# Patient Record
Sex: Female | Born: 1935 | Race: White | Hispanic: No
Health system: Southern US, Community
[De-identification: ages and names within clinical notes are randomized; demographics above are authoritative.]

## PROBLEM LIST (undated history)

## (undated) DIAGNOSIS — I482 Chronic atrial fibrillation, unspecified: Secondary | ICD-10-CM

## (undated) DIAGNOSIS — J189 Pneumonia, unspecified organism: Secondary | ICD-10-CM

## (undated) DIAGNOSIS — G4733 Obstructive sleep apnea (adult) (pediatric): Secondary | ICD-10-CM

## (undated) DIAGNOSIS — J9621 Acute and chronic respiratory failure with hypoxia: Secondary | ICD-10-CM

## (undated) DIAGNOSIS — I270 Primary pulmonary hypertension: Secondary | ICD-10-CM

---

## 2018-07-05 ENCOUNTER — Inpatient Hospital Stay
Admission: AD | Admit: 2018-07-05 | Discharge: 2018-07-29 | Disposition: A | Discharge status: Skilled Nursing Facility | Payer: Medicare Other | Source: Other Acute Inpatient Hospital | Attending: Internal Medicine | Admitting: Internal Medicine

## 2018-07-05 ENCOUNTER — Ambulatory Visit (HOSPITAL_COMMUNITY)
Admission: AD | Admit: 2018-07-05 | Discharge: 2018-07-05 | Disposition: A | Discharge status: Home / Self Care | Payer: Medicare Other | Source: Other Acute Inpatient Hospital | Attending: Internal Medicine | Admitting: Internal Medicine

## 2018-07-05 ENCOUNTER — Other Ambulatory Visit (HOSPITAL_COMMUNITY): Payer: Medicare Other

## 2018-07-05 DIAGNOSIS — G4733 Obstructive sleep apnea (adult) (pediatric): Secondary | ICD-10-CM | POA: Diagnosis present

## 2018-07-05 DIAGNOSIS — J189 Pneumonia, unspecified organism: Secondary | ICD-10-CM | POA: Diagnosis present

## 2018-07-05 DIAGNOSIS — J9621 Acute and chronic respiratory failure with hypoxia: Secondary | ICD-10-CM | POA: Diagnosis present

## 2018-07-05 DIAGNOSIS — I482 Chronic atrial fibrillation, unspecified: Secondary | ICD-10-CM | POA: Diagnosis present

## 2018-07-05 DIAGNOSIS — R092 Respiratory arrest: Secondary | ICD-10-CM | POA: Diagnosis present

## 2018-07-05 DIAGNOSIS — R109 Unspecified abdominal pain: Secondary | ICD-10-CM

## 2018-07-05 DIAGNOSIS — I270 Primary pulmonary hypertension: Secondary | ICD-10-CM | POA: Diagnosis present

## 2018-07-05 DIAGNOSIS — Z931 Gastrostomy status: Secondary | ICD-10-CM

## 2018-07-05 HISTORY — DX: Obstructive sleep apnea (adult) (pediatric): G47.33

## 2018-07-05 HISTORY — DX: Acute and chronic respiratory failure with hypoxia: J96.21

## 2018-07-05 HISTORY — DX: Primary pulmonary hypertension: I27.0

## 2018-07-05 HISTORY — DX: Pneumonia, unspecified organism: J18.9

## 2018-07-05 HISTORY — DX: Chronic atrial fibrillation, unspecified: I48.20

## 2018-07-05 MED ORDER — IOPAMIDOL (ISOVUE-300) INJECTION 61%
30.0000 mL | Freq: Once | INTRAVENOUS | Status: AC | PRN
  Administered 2018-07-05: 30 mL

## 2018-07-05 MED ORDER — IOPAMIDOL (ISOVUE-300) INJECTION 61%
INTRAVENOUS | Status: AC
  Filled 2018-07-05: qty 50

## 2018-07-06 DIAGNOSIS — J189 Pneumonia, unspecified organism: Secondary | ICD-10-CM | POA: Diagnosis not present

## 2018-07-06 DIAGNOSIS — I270 Primary pulmonary hypertension: Secondary | ICD-10-CM

## 2018-07-06 DIAGNOSIS — I482 Chronic atrial fibrillation, unspecified: Secondary | ICD-10-CM | POA: Diagnosis not present

## 2018-07-06 DIAGNOSIS — J9621 Acute and chronic respiratory failure with hypoxia: Secondary | ICD-10-CM | POA: Diagnosis not present

## 2018-07-06 DIAGNOSIS — G4733 Obstructive sleep apnea (adult) (pediatric): Secondary | ICD-10-CM | POA: Diagnosis not present

## 2018-07-06 LAB — BASIC METABOLIC PANEL
Anion gap: 11 (ref 5–15)
BUN: 23 mg/dL (ref 8–23)
CALCIUM: 8.7 mg/dL — AB (ref 8.9–10.3)
CO2: 34 mmol/L — ABNORMAL HIGH (ref 22–32)
CREATININE: 0.96 mg/dL (ref 0.44–1.00)
Chloride: 90 mmol/L — ABNORMAL LOW (ref 98–111)
GFR calc Af Amer: >60 mL/min (ref >60)
GFR, EST NON AFRICAN AMERICAN: 55 mL/min — AB (ref >60)
Glucose, Bld: 154 mg/dL — ABNORMAL HIGH (ref 70–99)
Potassium: 4.1 mmol/L (ref 3.5–5.1)
Sodium: 135 mmol/L (ref 135–145)

## 2018-07-06 LAB — CBC WITH DIFFERENTIAL/PLATELET
Abs Immature Granulocytes: 0.09 10*3/uL — ABNORMAL HIGH (ref 0.00–0.07)
BASOS PCT: 0 %
Basophils Absolute: 0 10*3/uL (ref 0.0–0.1)
EOS PCT: 0 %
Eosinophils Absolute: 0 10*3/uL (ref 0.0–0.5)
HCT: 28.7 % — ABNORMAL LOW (ref 36.0–46.0)
HEMOGLOBIN: 9.2 g/dL — AB (ref 12.0–15.0)
Immature Granulocytes: 1 %
Lymphocytes Relative: 3 %
Lymphs Abs: 0.3 10*3/uL — ABNORMAL LOW (ref 0.7–4.0)
MCH: 28.7 pg (ref 26.0–34.0)
MCHC: 32.1 g/dL (ref 30.0–36.0)
MCV: 89.4 fL (ref 80.0–100.0)
Monocytes Absolute: 0.4 10*3/uL (ref 0.1–1.0)
Monocytes Relative: 3 %
Neutro Abs: 12.8 10*3/uL — ABNORMAL HIGH (ref 1.7–7.7)
Neutrophils Relative %: 93 %
Platelets: 222 10*3/uL (ref 150–400)
RBC: 3.21 MIL/uL — ABNORMAL LOW (ref 3.87–5.11)
RDW: 13.2 % (ref 11.5–15.5)
WBC: 13.6 10*3/uL — ABNORMAL HIGH (ref 4.0–10.5)
nRBC: 0 % (ref 0.0–0.2)

## 2018-07-06 LAB — BLOOD GAS, ARTERIAL
Acid-Base Excess: 13.1 mmol/L — ABNORMAL HIGH (ref 0.0–2.0)
Bicarbonate: 37.8 mmol/L — ABNORMAL HIGH (ref 20.0–28.0)
FIO2: 0.5
MECHVT: 400 mL
O2 Saturation: 94.4 %
PCO2 ART: 55.2 mmHg — AB (ref 32.0–48.0)
PEEP: 5 cmH2O
Patient temperature: 98.6
RATE: 12 resp/min
pH, Arterial: 7.451 — ABNORMAL HIGH (ref 7.350–7.450)
pO2, Arterial: 71.2 mmHg — ABNORMAL LOW (ref 83.0–108.0)

## 2018-07-06 NOTE — Consult Note (Signed)
Pulmonary Critical Care Medicine [facility] [place]  PULMONARY SERVICE  Date of Service: 07/06/2018  PULMONARY CRITICAL CARE CONSULT   Kristi Leon  JIR:[id number]  DOB: [birth year]   DOA: 07/05/2018  Referring Physician: Carron Curie, MD  HPI: Kristi Leon is a 85 y.o. female seen for follow up of Acute on Chronic Respiratory Failure.  Patient has multiple medical problems was transferred to our facility for further management and weaning.  Patient has a history of insulin-dependent diabetes mellitus paroxysmal atrial fibrillation chronic anticoagulation hypertension hyperlipidemia overactive bladder polymyalgia rheumatica GERD sleep apnea presented to the hospital because of increased cough and shortness of breath.  At that time patient was found to have a temperature of 100.5.  Chest x-ray was done which showed multifocal pneumonia so she was started on sepsis protocol.  Patient was given fluid bolus for low blood pressure also given steroids and started on antibiotics in the form of IV azithromycin as well as Rocephin.  Conservative measures however did not show improvement patient had been using BiPAP this failed so patient ended up getting intubated on the ventilator.  During all of this cardiology was asked to see the patient because of elevated troponins and echocardiogram was done which was unremarkable.  She was weaned and extubated however because of failure to wean and extubate she was started on BiPAP this also failed and eventually ended up having to be intubated.  Subsequently underwent a tracheostomy and PEG tube for anticipated prolonged mechanical ventilation  Review of Systems:  ROS performed and is unremarkable other than noted above.  Past medical history: Hypertension Coronary artery disease Pulmonary hypertension Sleep apnea Noncompliance Atrial fibrillation Hyperlipidemia Chronic asthma Overactive bladder  Past surgical history: Tracheostomy PEG  tube  Family history: Noncontributory to the present illness  Allergies: Reviewed on the medication administration chart  Medications: Reviewed on Rounds  Physical Exam:  Vitals: Temperature 99.4 pulse 64 respiratory rate 20 blood pressure 139/73 saturations 93%  Ventilator Settings mode ventilation assist control FiO2 45% tidal volume 800 PEEP 12  . General: Comfortable at this time . Eyes: Grossly normal lids, irises & conjunctiva . ENT: grossly tongue is normal . Neck: no obvious mass . Cardiovascular: S1-S2 normal no gallop or rub . Respiratory: Scattered distant rhonchi diminished . Abdomen: Soft and nontender . Skin: no rash seen on limited exam . Musculoskeletal: not rigid . Psychiatric:unable to assess . Neurologic: no seizure no involuntary movements         Labs on Admission:  Basic Metabolic Panel: Recent Labs  Lab 07/06/18 0456  NA 135  K 4.1  CL 90*  CO2 34*  GLUCOSE 154*  BUN 23  CREATININE 0.96  CALCIUM 8.7*    Recent Labs  Lab 07/06/18 0510  PHART 7.451*  PCO2ART 55.2*  PO2ART 71.2*  HCO3 37.8*  O2SAT 94.4    Liver Function Tests: No results for input(s): AST, ALT, ALKPHOS, BILITOT, PROT, ALBUMIN in the last 168 hours. No results for input(s): LIPASE, AMYLASE in the last 168 hours. No results for input(s): AMMONIA in the last 168 hours.  CBC: Recent Labs  Lab 07/06/18 0456  WBC 13.6*  NEUTROABS 12.8*  HGB 9.2*  HCT 28.7*  MCV 89.4  PLT 222    Cardiac Enzymes: No results for input(s): CKTOTAL, CKMB, CKMBINDEX, TROPONINI in the last 168 hours.  BNP (last 3 results) No results for input(s): BNP in the last 8760 hours.  ProBNP (last 3 results) No results for input(s): PROBNP  in the last 8760 hours.   Radiological Exams on Admission: Dg Abdomen Peg Tube Location  Result Date: 07/05/2018 CLINICAL DATA:  Peg tube placement. EXAM: ABDOMEN - 1 VIEW COMPARISON:  None. FINDINGS: Contrast injected through the gastrostomy tube  opacifies the gastric fundus. The bowel gas pattern is normal. No radio-opaque calculi or other significant radiographic abnormality are seen. IMPRESSION: 1. Appropriately positioned gastrostomy tube. Electronically Signed   By: Obie Dredge M.D.   On: 07/05/2018 21:10    Assessment/Plan Active Problems:   Acute on chronic respiratory failure with hypoxia (HCC)   Chronic atrial fibrillation   Community acquired pneumonia, bilateral   Obstructive sleep apnea   Pulmonary hypertension, primary (HCC)   1. Acute on chronic respiratory failure with hypoxia at this time patient is on assist control with an FiO2 45% tidal volume 800 PEEP 12 our first goal will be to try to wean the FiO2 and PEEP as tolerated.  Once she is down to the appropriate levels we will begin her on a wean protocol. 2. Chronic atrial fibrillation patient is on anticoagulation with Eliquis monitor for bleeding continue with supportive care. 3. Bilateral pneumonia multifocal patient was treated with antibiotics with azithromycin and Rocephin continue with supportive care. 4. Obstructive sleep apnea nonissue at this time 5. Pulmonary hypertension continue with oxygen therapy supportive care  I have personally seen and evaluated the patient, evaluated laboratory and imaging results, formulated the assessment and plan and placed orders. The Patient requires high complexity decision making for assessment and support.  Case was discussed on Rounds with the Respiratory Therapy Staff Time Spent  Yevonne Pax, MD [facility] Pulmonary Critical Care Medicine Sleep Medicine

## 2018-07-07 ENCOUNTER — Encounter: Payer: Self-pay | Admitting: Internal Medicine

## 2018-07-07 ENCOUNTER — Other Ambulatory Visit (HOSPITAL_COMMUNITY): Payer: Medicare Other

## 2018-07-07 DIAGNOSIS — J9621 Acute and chronic respiratory failure with hypoxia: Secondary | ICD-10-CM | POA: Diagnosis present

## 2018-07-07 DIAGNOSIS — I482 Chronic atrial fibrillation, unspecified: Secondary | ICD-10-CM | POA: Diagnosis not present

## 2018-07-07 DIAGNOSIS — J189 Pneumonia, unspecified organism: Secondary | ICD-10-CM | POA: Diagnosis present

## 2018-07-07 DIAGNOSIS — G4733 Obstructive sleep apnea (adult) (pediatric): Secondary | ICD-10-CM | POA: Diagnosis present

## 2018-07-07 DIAGNOSIS — I270 Primary pulmonary hypertension: Secondary | ICD-10-CM | POA: Diagnosis present

## 2018-07-07 LAB — C DIFFICILE QUICK SCREEN W PCR REFLEX
C DIFFICILE (CDIFF) INTERP: NOT DETECTED
C Diff antigen: NEGATIVE
C Diff toxin: NEGATIVE

## 2018-07-07 NOTE — Progress Notes (Addendum)
[facility] [place]   PULMONARY CRITICAL CARE SERVICE  PROGRESS NOTE  Date of Service: 07/07/2018  Kristi Reelse Tang Jasmer  ZOX:[id number]  DOB: [birth year]   DOA: 07/05/2018  Referring Physician: Carron CurieAli Hijazi, MD  HPI: Kristi Leon is a 85 y.o. female seen for follow up of Acute on Chronic Respiratory Failure.  Patient remains on full ventilator support at this time.  However PEEP was adjusted from 12-10 today.  Patient seems to be tolerating well we will continue to wean as available.  Medications: Reviewed on Rounds  Physical Exam:  Vitals: Pulse 60 respirations 12 BP 139/62 O2 sat 96% temp 97.9  Ventilator Settings ventilator mode AC VC rate 18 tidal volume 400 PEEP 10 FiO2 40%  . General: Comfortable at this time . Eyes: Grossly normal lids, irises & conjunctiva . ENT: grossly tongue is normal . Neck: no obvious mass . Cardiovascular: S1 S2 normal no gallop . Respiratory: Coarse breath sounds noted . Abdomen: soft . Skin: no rash seen on limited exam . Musculoskeletal: not rigid . Psychiatric:unable to assess . Neurologic: no seizure no involuntary movements         Lab Data:   Basic Metabolic Panel: Recent Labs  Lab 07/06/18 0456  NA 135  K 4.1  CL 90*  CO2 34*  GLUCOSE 154*  BUN 23  CREATININE 0.96  CALCIUM 8.7*    ABG: Recent Labs  Lab 07/06/18 0510  PHART 7.451*  PCO2ART 55.2*  PO2ART 71.2*  HCO3 37.8*  O2SAT 94.4    Liver Function Tests: No results for input(s): AST, ALT, ALKPHOS, BILITOT, PROT, ALBUMIN in the last 168 hours. No results for input(s): LIPASE, AMYLASE in the last 168 hours. No results for input(s): AMMONIA in the last 168 hours.  CBC: Recent Labs  Lab 07/06/18 0456  WBC 13.6*  NEUTROABS 12.8*  HGB 9.2*  HCT 28.7*  MCV 89.4  PLT 222    Cardiac Enzymes: No results for input(s): CKTOTAL, CKMB, CKMBINDEX, TROPONINI in the last 168 hours.  BNP (last 3 results) No results for  input(s): BNP in the last 8760 hours.  ProBNP (last 3 results) No results for input(s): PROBNP in the last 8760 hours.  Radiological Exams: Dg Abdomen Peg Tube Location  Result Date: 07/05/2018 CLINICAL DATA:  Peg tube placement. EXAM: ABDOMEN - 1 VIEW COMPARISON:  None. FINDINGS: Contrast injected through the gastrostomy tube opacifies the gastric fundus. The bowel gas pattern is normal. No radio-opaque calculi or other significant radiographic abnormality are seen. IMPRESSION: 1. Appropriately positioned gastrostomy tube. Electronically Signed   By: Obie DredgeWilliam T Derry M.D.   On: 07/05/2018 21:10   Dg Abd Portable 1v  Result Date: 07/07/2018 CLINICAL DATA:  Abdominal pain. EXAM: PORTABLE ABDOMEN - 1 VIEW COMPARISON:  Radiograph July 05, 2018. FINDINGS: The bowel gas pattern is normal. Gastrostomy tube noted on prior exam is not clearly visualized currently. No radio-opaque calculi or other significant radiographic abnormality are seen. IMPRESSION: No evidence of bowel obstruction or ileus. Gastrostomy tube seen on prior exam is not clearly visualized currently. Electronically Signed   By: Lupita RaiderJames  Green Jr, M.D.   On: 07/07/2018 17:55    Assessment/Plan Active Problems:   Acute on chronic respiratory failure with hypoxia (HCC)   Chronic atrial fibrillation   Community acquired pneumonia, bilateral   Obstructive sleep apnea   Pulmonary hypertension, primary (HCC)   1. Acute on chronic respiratory failure with hypoxia patient remains on 40% FiO2 full support AC  VC.  Weaned from a PEEP of 12 to a PEEP of 10 today patient is doing well.  We will continue as tolerated. 2. Chronic atrial fibrillation patient is anticoagulated with Eliquis continue to monitor, continue supportive care. 3. Bilateral pneumonia multifocal patient was treated with antibiotics continue supportive care 4. Obstructive sleep apnea nonissue 5. Pulmonary hypertension continue O2 therapy and supportive care   I have  personally seen and evaluated the patient, evaluated laboratory and imaging results, formulated the assessment and plan and placed orders. The Patient requires high complexity decision making for assessment and support.  Case was discussed on Rounds with the Respiratory Therapy Staff  Yevonne PaxSaadat A Khan, MD [facility] Pulmonary Critical Care Medicine Sleep Medicine

## 2018-07-08 DIAGNOSIS — I482 Chronic atrial fibrillation, unspecified: Secondary | ICD-10-CM | POA: Diagnosis not present

## 2018-07-08 DIAGNOSIS — J9621 Acute and chronic respiratory failure with hypoxia: Secondary | ICD-10-CM | POA: Diagnosis not present

## 2018-07-08 DIAGNOSIS — J189 Pneumonia, unspecified organism: Secondary | ICD-10-CM | POA: Diagnosis not present

## 2018-07-08 DIAGNOSIS — G4733 Obstructive sleep apnea (adult) (pediatric): Secondary | ICD-10-CM | POA: Diagnosis not present

## 2018-07-08 NOTE — Progress Notes (Signed)
Pulmonary Critical Care Medicine [facility] [place]   PULMONARY CRITICAL CARE SERVICE  PROGRESS NOTE  Date of Service: 07/08/2018  Kristi Leon  PVX:[id number]  DOB: June 07, 1934   DOA: 07/05/2018  Referring Physician: Carron Curie, MD  HPI: Kristi Leon is a 85 y.o. female seen for follow up of Acute on Chronic Respiratory Failure.  Patient was able to wean for a couple of hours today right now is on pressure support mode has been on 45% FiO2 however  Medications: Reviewed on Rounds  Physical Exam:  Vitals: Temperature 98.4 pulse 78 respiratory rate 22 blood pressure 153/79 saturations 99%  Ventilator Settings mode ventilation pressure support FiO2 45% PEEP 8 pressure support 12  . General: Comfortable at this time . Eyes: Grossly normal lids, irises & conjunctiva . ENT: grossly tongue is normal . Neck: no obvious mass . Cardiovascular: S1 S2 normal no gallop . Respiratory: Coarse breath sounds with a few rhonchi . Abdomen: soft . Skin: no rash seen on limited exam . Musculoskeletal: not rigid . Psychiatric:unable to assess . Neurologic: no seizure no involuntary movements         Lab Data:   Basic Metabolic Panel: Recent Labs  Lab 07/06/18 0456  NA 135  K 4.1  CL 90*  CO2 34*  GLUCOSE 154*  BUN 23  CREATININE 0.96  CALCIUM 8.7*    ABG: Recent Labs  Lab 07/06/18 0510  PHART 7.451*  PCO2ART 55.2*  PO2ART 71.2*  HCO3 37.8*  O2SAT 94.4    Liver Function Tests: No results for input(s): AST, ALT, ALKPHOS, BILITOT, PROT, ALBUMIN in the last 168 hours. No results for input(s): LIPASE, AMYLASE in the last 168 hours. No results for input(s): AMMONIA in the last 168 hours.  CBC: Recent Labs  Lab 07/06/18 0456  WBC 13.6*  NEUTROABS 12.8*  HGB 9.2*  HCT 28.7*  MCV 89.4  PLT 222    Cardiac Enzymes: No results for input(s): CKTOTAL, CKMB, CKMBINDEX, TROPONINI in the last 168 hours.  BNP (last 3 results) No results for input(s):  BNP in the last 8760 hours.  ProBNP (last 3 results) No results for input(s): PROBNP in the last 8760 hours.  Radiological Exams: Dg Abd Portable 1v  Result Date: 07/07/2018 CLINICAL DATA:  Abdominal pain. EXAM: PORTABLE ABDOMEN - 1 VIEW COMPARISON:  Radiograph July 05, 2018. FINDINGS: The bowel gas pattern is normal. Gastrostomy tube noted on prior exam is not clearly visualized currently. No radio-opaque calculi or other significant radiographic abnormality are seen. IMPRESSION: No evidence of bowel obstruction or ileus. Gastrostomy tube seen on prior exam is not clearly visualized currently. Electronically Signed   By: Lupita Raider, M.D.   On: 07/07/2018 17:55    Assessment/Plan Active Problems:   Acute on chronic respiratory failure with hypoxia (HCC)   Chronic atrial fibrillation   Community acquired pneumonia, bilateral   Obstructive sleep apnea   Pulmonary hypertension, primary (HCC)   1. Acute on chronic respiratory failure with hypoxia we will continue with the pressure support wean goal is up to 4 hours today.  Titrate oxygen as tolerated. 2. Chronic atrial fibrillation rate controlled 3. Community-acquired pneumonia treated improving 4. OSA nonissue at this time 5. Pulmonary hypertension continue oxygen therapy   I have personally seen and evaluated the patient, evaluated laboratory and imaging results, formulated the assessment and plan and placed orders. The Patient requires high complexity decision making for assessment and support.  Case was discussed on Rounds with the  Respiratory Therapy Staff  Allyne Gee, MD [facility] Pulmonary Critical Care Medicine Sleep Medicine

## 2018-07-09 DIAGNOSIS — G4733 Obstructive sleep apnea (adult) (pediatric): Secondary | ICD-10-CM | POA: Diagnosis not present

## 2018-07-09 DIAGNOSIS — J189 Pneumonia, unspecified organism: Secondary | ICD-10-CM | POA: Diagnosis not present

## 2018-07-09 DIAGNOSIS — I482 Chronic atrial fibrillation, unspecified: Secondary | ICD-10-CM | POA: Diagnosis not present

## 2018-07-09 DIAGNOSIS — J9621 Acute and chronic respiratory failure with hypoxia: Secondary | ICD-10-CM | POA: Diagnosis not present

## 2018-07-09 NOTE — Progress Notes (Signed)
Pulmonary Critical Care Medicine [facility] [place]   PULMONARY CRITICAL CARE SERVICE  PROGRESS NOTE  Date of Service: 07/09/2018  Laliah Lichte  XTA:[id number]  DOB: 04-Dec-1933   DOA: 07/05/2018  Referring Physician: Carron Curie, MD  HPI: Kaylonie Blandford is a 85 y.o. female seen for follow up of Acute on Chronic Respiratory Failure.  Patient is weaning right now on pressure support mode has been doing well 28% FiO2 pressure 12 PEEP 8  Medications: Reviewed on Rounds  Physical Exam:  Vitals: Temperature 98.0 pulse 114 respiratory rate 30 blood pressure 143/84 saturations 100%  Ventilator Settings mode ventilation pressure support FiO2 28% pressure support 12 PEEP 8  . General: Comfortable at this time . Eyes: Grossly normal lids, irises & conjunctiva . ENT: grossly tongue is normal . Neck: no obvious mass . Cardiovascular: S1 S2 normal no gallop . Respiratory: Coarse breath sounds with few rhonchi . Abdomen: soft . Skin: no rash seen on limited exam . Musculoskeletal: not rigid . Psychiatric:unable to assess . Neurologic: no seizure no involuntary movements         Lab Data:   Basic Metabolic Panel: Recent Labs  Lab 07/06/18 0456  NA 135  K 4.1  CL 90*  CO2 34*  GLUCOSE 154*  BUN 23  CREATININE 0.96  CALCIUM 8.7*    ABG: Recent Labs  Lab 07/06/18 0510  PHART 7.451*  PCO2ART 55.2*  PO2ART 71.2*  HCO3 37.8*  O2SAT 94.4    Liver Function Tests: No results for input(s): AST, ALT, ALKPHOS, BILITOT, PROT, ALBUMIN in the last 168 hours. No results for input(s): LIPASE, AMYLASE in the last 168 hours. No results for input(s): AMMONIA in the last 168 hours.  CBC: Recent Labs  Lab 07/06/18 0456  WBC 13.6*  NEUTROABS 12.8*  HGB 9.2*  HCT 28.7*  MCV 89.4  PLT 222    Cardiac Enzymes: No results for input(s): CKTOTAL, CKMB, CKMBINDEX, TROPONINI in the last 168 hours.  BNP (last 3 results) No results for input(s): BNP in the last 8760  hours.  ProBNP (last 3 results) No results for input(s): PROBNP in the last 8760 hours.  Radiological Exams: Dg Abd Portable 1v  Result Date: 07/07/2018 CLINICAL DATA:  Abdominal pain. EXAM: PORTABLE ABDOMEN - 1 VIEW COMPARISON:  Radiograph July 05, 2018. FINDINGS: The bowel gas pattern is normal. Gastrostomy tube noted on prior exam is not clearly visualized currently. No radio-opaque calculi or other significant radiographic abnormality are seen. IMPRESSION: No evidence of bowel obstruction or ileus. Gastrostomy tube seen on prior exam is not clearly visualized currently. Electronically Signed   By: Lupita Raider, M.D.   On: 07/07/2018 17:55    Assessment/Plan Active Problems:   Acute on chronic respiratory failure with hypoxia (HCC)   Chronic atrial fibrillation   Community acquired pneumonia, bilateral   Obstructive sleep apnea   Pulmonary hypertension, primary (HCC)   1. Acute on chronic respiratory failure with hypoxia we will continue with pressure support titrate oxygen continue pulmonary toilet 2. Chronic atrial fibrillation rate is controlled at this time we will continue with supportive care 3. Community-acquired pneumonia treated we will continue to follow 4. OSA at baseline 5. Pulmonary hypertension supportive care oxygen therapy   I have personally seen and evaluated the patient, evaluated laboratory and imaging results, formulated the assessment and plan and placed orders. The Patient requires high complexity decision making for assessment and support.  Case was discussed on Rounds with the Respiratory Therapy  Staff  Allyne Gee, MD [facility] Pulmonary Critical Care Medicine Sleep Medicine

## 2018-07-10 DIAGNOSIS — G4733 Obstructive sleep apnea (adult) (pediatric): Secondary | ICD-10-CM | POA: Diagnosis not present

## 2018-07-10 DIAGNOSIS — J9621 Acute and chronic respiratory failure with hypoxia: Secondary | ICD-10-CM | POA: Diagnosis not present

## 2018-07-10 DIAGNOSIS — J189 Pneumonia, unspecified organism: Secondary | ICD-10-CM | POA: Diagnosis not present

## 2018-07-10 DIAGNOSIS — I482 Chronic atrial fibrillation, unspecified: Secondary | ICD-10-CM | POA: Diagnosis not present

## 2018-07-10 NOTE — Progress Notes (Addendum)
Pulmonary Critical Care Medicine [facility] [place]   PULMONARY CRITICAL CARE SERVICE  PROGRESS NOTE  Date of Service: 07/10/2018  Kristi Leon  NHA:[id number]  DOB: [birth year]   DOA: 07/05/2018  Referring Physician: Carron Curie, MD  HPI: Kristi Leon is a 85 y.o. female seen for follow up of Acute on Chronic Respiratory Failure.  Patient is currently weaning and is on pressure support mode.  Has been doing well on pressure support 12/5 FiO2 28%  Medications: Reviewed on Rounds  Physical Exam:  Vitals: Pulse 91 respirations 20 BP 134/76 O2 sat 99% temp 98.5  Ventilator Settings ventilator mode pressure support 12/5 FiO2 20%  . General: Comfortable at this time . Eyes: Grossly normal lids, irises & conjunctiva . ENT: grossly tongue is normal . Neck: no obvious mass . Cardiovascular: S1 S2 normal no gallop . Respiratory: Coarse breath sounds . Abdomen: soft . Skin: no rash seen on limited exam . Musculoskeletal: not rigid . Psychiatric:unable to assess . Neurologic: no seizure no involuntary movements         Lab Data:   Basic Metabolic Panel: Recent Labs  Lab 07/06/18 0456  NA 135  K 4.1  CL 90*  CO2 34*  GLUCOSE 154*  BUN 23  CREATININE 0.96  CALCIUM 8.7*    ABG: Recent Labs  Lab 07/06/18 0510  PHART 7.451*  PCO2ART 55.2*  PO2ART 71.2*  HCO3 37.8*  O2SAT 94.4    Liver Function Tests: No results for input(s): AST, ALT, ALKPHOS, BILITOT, PROT, ALBUMIN in the last 168 hours. No results for input(s): LIPASE, AMYLASE in the last 168 hours. No results for input(s): AMMONIA in the last 168 hours.  CBC: Recent Labs  Lab 07/06/18 0456  WBC 13.6*  NEUTROABS 12.8*  HGB 9.2*  HCT 28.7*  MCV 89.4  PLT 222    Cardiac Enzymes: No results for input(s): CKTOTAL, CKMB, CKMBINDEX, TROPONINI in the last 168 hours.  BNP (last 3 results) No results for input(s): BNP in the last 8760 hours.  ProBNP (last 3 results) No results for  input(s): PROBNP in the last 8760 hours.  Radiological Exams: No results found.  Assessment/Plan Active Problems:   Acute on chronic respiratory failure with hypoxia (HCC)   Chronic atrial fibrillation   Community acquired pneumonia, bilateral   Obstructive sleep apnea   Pulmonary hypertension, primary (HCC)   1. Acute on chronic respiratory failure with hypoxia continue pressure support and titrate oxygen per protocol.  Continue pulmonary toilet. 2. Chronic atrial fibrillation rate is controlled continue supportive care 3. Community-acquired pneumonia treated continue to follow 4. OSA at baseline 5. Pulmonary hypertension continue supportive care and oxygen therapy   I have personally seen and evaluated the patient, evaluated laboratory and imaging results, formulated the assessment and plan and placed orders. The Patient requires high complexity decision making for assessment and support.  Case was discussed on Rounds with the Respiratory Therapy Staff  Yevonne Pax, MD [facility] Pulmonary Critical Care Medicine Sleep Medicine

## 2018-07-11 DIAGNOSIS — I482 Chronic atrial fibrillation, unspecified: Secondary | ICD-10-CM | POA: Diagnosis not present

## 2018-07-11 DIAGNOSIS — G4733 Obstructive sleep apnea (adult) (pediatric): Secondary | ICD-10-CM | POA: Diagnosis not present

## 2018-07-11 DIAGNOSIS — J189 Pneumonia, unspecified organism: Secondary | ICD-10-CM | POA: Diagnosis not present

## 2018-07-11 DIAGNOSIS — J9621 Acute and chronic respiratory failure with hypoxia: Secondary | ICD-10-CM | POA: Diagnosis not present

## 2018-07-11 LAB — CBC WITH DIFFERENTIAL/PLATELET
Abs Immature Granulocytes: 0.09 10*3/uL — ABNORMAL HIGH (ref 0.00–0.07)
Basophils Absolute: 0 10*3/uL (ref 0.0–0.1)
Basophils Relative: 0 %
EOS ABS: 0 10*3/uL (ref 0.0–0.5)
Eosinophils Relative: 0 %
HCT: 33.4 % — ABNORMAL LOW (ref 36.0–46.0)
Hemoglobin: 10.5 g/dL — ABNORMAL LOW (ref 12.0–15.0)
Immature Granulocytes: 1 %
LYMPHS ABS: 0.7 10*3/uL (ref 0.7–4.0)
Lymphocytes Relative: 6 %
MCH: 28.1 pg (ref 26.0–34.0)
MCHC: 31.4 g/dL (ref 30.0–36.0)
MCV: 89.3 fL (ref 80.0–100.0)
Monocytes Absolute: 0.6 10*3/uL (ref 0.1–1.0)
Monocytes Relative: 5 %
Neutro Abs: 10.8 10*3/uL — ABNORMAL HIGH (ref 1.7–7.7)
Neutrophils Relative %: 88 %
Platelets: 272 10*3/uL (ref 150–400)
RBC: 3.74 MIL/uL — ABNORMAL LOW (ref 3.87–5.11)
RDW: 13.2 % (ref 11.5–15.5)
WBC: 12.3 10*3/uL — ABNORMAL HIGH (ref 4.0–10.5)
nRBC: 0 % (ref 0.0–0.2)

## 2018-07-11 NOTE — Progress Notes (Addendum)
Pulmonary Critical Care Medicine [facility] [place]   PULMONARY CRITICAL CARE SERVICE  PROGRESS NOTE  Date of Service: 07/11/2018  Kristi Leon  XKG:[id number]  DOB: 1933/10/18   DOA: 07/05/2018  Referring Physician: Carron Curie, MD  HPI: Kristi Leon is a 85 y.o. female seen for follow up of Acute on Chronic Respiratory Failure.  Patient continues weaning protocol.  She has a goal of 16 hours today on pressure support 12/5 with FiO2 28%.  She is currently doing well minimal secretions.  Medications: Reviewed on Rounds  Physical Exam:  Vitals: Pulse 100 respirations 20 BP 124/74 O2 sat 100% temp 97.7  Ventilator Settings ventilator mode pressure support 12/5 FiO2 20%  . General: Comfortable at this time . Eyes: Grossly normal lids, irises & conjunctiva . ENT: grossly tongue is normal . Neck: no obvious mass . Cardiovascular: S1 S2 normal no gallop . Respiratory: No rales or rhonchi noted . Abdomen: soft . Skin: no rash seen on limited exam . Musculoskeletal: not rigid . Psychiatric:unable to assess . Neurologic: no seizure no involuntary movements         Lab Data:   Basic Metabolic Panel: Recent Labs  Lab 07/06/18 0456  NA 135  K 4.1  CL 90*  CO2 34*  GLUCOSE 154*  BUN 23  CREATININE 0.96  CALCIUM 8.7*    ABG: Recent Labs  Lab 07/06/18 0510  PHART 7.451*  PCO2ART 55.2*  PO2ART 71.2*  HCO3 37.8*  O2SAT 94.4    Liver Function Tests: No results for input(s): AST, ALT, ALKPHOS, BILITOT, PROT, ALBUMIN in the last 168 hours. No results for input(s): LIPASE, AMYLASE in the last 168 hours. No results for input(s): AMMONIA in the last 168 hours.  CBC: Recent Labs  Lab 07/06/18 0456 07/11/18 0611  WBC 13.6* 12.3*  NEUTROABS 12.8* 10.8*  HGB 9.2* 10.5*  HCT 28.7* 33.4*  MCV 89.4 89.3  PLT 222 272    Cardiac Enzymes: No results for input(s): CKTOTAL, CKMB, CKMBINDEX, TROPONINI in the last 168 hours.  BNP (last 3 results) No  results for input(s): BNP in the last 8760 hours.  ProBNP (last 3 results) No results for input(s): PROBNP in the last 8760 hours.  Radiological Exams: No results found.  Assessment/Plan Active Problems:   Acute on chronic respiratory failure with hypoxia (HCC)   Chronic atrial fibrillation   Community acquired pneumonia, bilateral   Obstructive sleep apnea   Pulmonary hypertension, primary (HCC)   1. Acute on chronic respiratory failure with hypoxia continue presents port and titrate oxygen per protocol.  Continue pulmonary toilet. 2. Chronic atrial fibrillation rate controlled continue supportive care 3. Community-acquired pneumonia treated continue to follow 4. OSA at baseline 5. Pulmonary hypertension continue supportive care and oxygen therapy   I have personally seen and evaluated the patient, evaluated laboratory and imaging results, formulated the assessment and plan and placed orders. The Patient requires high complexity decision making for assessment and support.  Case was discussed on Rounds with the Respiratory Therapy Staff  Yevonne Pax, MD [facility] Pulmonary Critical Care Medicine Sleep Medicine

## 2018-07-12 DIAGNOSIS — I482 Chronic atrial fibrillation, unspecified: Secondary | ICD-10-CM | POA: Diagnosis not present

## 2018-07-12 DIAGNOSIS — J9621 Acute and chronic respiratory failure with hypoxia: Secondary | ICD-10-CM | POA: Diagnosis not present

## 2018-07-12 DIAGNOSIS — G4733 Obstructive sleep apnea (adult) (pediatric): Secondary | ICD-10-CM | POA: Diagnosis not present

## 2018-07-12 DIAGNOSIS — J189 Pneumonia, unspecified organism: Secondary | ICD-10-CM | POA: Diagnosis not present

## 2018-07-12 LAB — URINE CULTURE

## 2018-07-12 NOTE — Progress Notes (Signed)
Pulmonary Critical Care Medicine [facility] [place]   PULMONARY CRITICAL CARE SERVICE  PROGRESS NOTE  Date of Service: 07/12/2018  Kristi Leon  MLJ:[id number]  DOB: 06-19-1933   DOA: 07/05/2018  Referring Physician: Carron Curie, MD  HPI: Kristi Leon is a 85 y.o. female seen for follow up of Acute on Chronic Respiratory Failure.  Patient is comfortable right now on 40% FiO2 has been weaning on T collar  Medications: Reviewed on Rounds  Physical Exam:  Vitals: Temperature 97.5 pulse 94 respiratory 22 blood pressure 120/64 saturations 98%  Ventilator Settings off the ventilator on T collar FiO2 40%  . General: Comfortable at this time . Eyes: Grossly normal lids, irises & conjunctiva . ENT: grossly tongue is normal . Neck: no obvious mass . Cardiovascular: S1 S2 normal no gallop . Respiratory: Scattered rhonchi expansion is equal . Abdomen: soft . Skin: no rash seen on limited exam . Musculoskeletal: not rigid . Psychiatric:unable to assess . Neurologic: no seizure no involuntary movements         Lab Data:   Basic Metabolic Panel: Recent Labs  Lab 07/06/18 0456  NA 135  K 4.1  CL 90*  CO2 34*  GLUCOSE 154*  BUN 23  CREATININE 0.96  CALCIUM 8.7*    ABG: Recent Labs  Lab 07/06/18 0510  PHART 7.451*  PCO2ART 55.2*  PO2ART 71.2*  HCO3 37.8*  O2SAT 94.4    Liver Function Tests: No results for input(s): AST, ALT, ALKPHOS, BILITOT, PROT, ALBUMIN in the last 168 hours. No results for input(s): LIPASE, AMYLASE in the last 168 hours. No results for input(s): AMMONIA in the last 168 hours.  CBC: Recent Labs  Lab 07/06/18 0456 07/11/18 0611  WBC 13.6* 12.3*  NEUTROABS 12.8* 10.8*  HGB 9.2* 10.5*  HCT 28.7* 33.4*  MCV 89.4 89.3  PLT 222 272    Cardiac Enzymes: No results for input(s): CKTOTAL, CKMB, CKMBINDEX, TROPONINI in the last 168 hours.  BNP (last 3 results) No results for input(s): BNP in the last 8760 hours.  ProBNP  (last 3 results) No results for input(s): PROBNP in the last 8760 hours.  Radiological Exams: No results found.  Assessment/Plan Active Problems:   Acute on chronic respiratory failure with hypoxia (HCC)   Chronic atrial fibrillation   Community acquired pneumonia, bilateral   Obstructive sleep apnea   Pulmonary hypertension, primary (HCC)   1. Acute on chronic respiratory failure with hypoxia we will continue with the T collar trials on 40% FiO2 try to titrate the FiO2 down 2. Chronic atrial fibrillation rate is controlled at this time 3. Community-acquired pneumonia treated we will monitor 4. OSA treated currently not an issue 5. Primary pulmonary hypertension at baseline we will continue present management   I have personally seen and evaluated the patient, evaluated laboratory and imaging results, formulated the assessment and plan and placed orders. The Patient requires high complexity decision making for assessment and support.  Case was discussed on Rounds with the Respiratory Therapy Staff  Yevonne Pax, MD [facility] Pulmonary Critical Care Medicine Sleep Medicine

## 2018-07-13 ENCOUNTER — Other Ambulatory Visit (HOSPITAL_COMMUNITY): Payer: Medicare Other

## 2018-07-13 DIAGNOSIS — J9621 Acute and chronic respiratory failure with hypoxia: Secondary | ICD-10-CM | POA: Diagnosis not present

## 2018-07-13 DIAGNOSIS — J189 Pneumonia, unspecified organism: Secondary | ICD-10-CM | POA: Diagnosis not present

## 2018-07-13 DIAGNOSIS — I482 Chronic atrial fibrillation, unspecified: Secondary | ICD-10-CM | POA: Diagnosis not present

## 2018-07-13 DIAGNOSIS — G4733 Obstructive sleep apnea (adult) (pediatric): Secondary | ICD-10-CM | POA: Diagnosis not present

## 2018-07-13 NOTE — Progress Notes (Signed)
Pulmonary Critical Care Medicine [facility] [place]   PULMONARY CRITICAL CARE SERVICE  PROGRESS NOTE  Date of Service: 07/13/2018  Kristi Leon  [id number]:[id number]  DOB: 09-22-1933   DOA: 07/05/2018  Referring Physician: Carron Curie, MD  HPI: Kristi Leon is a 85 y.o. female seen for follow up of Acute on Chronic Respiratory Failure.  Currently patient is comfortable without distress remains on 35% FiO2 on T collar at this time  Medications: Reviewed on Rounds  Physical Exam:  Vitals: Temperature 97.3 pulse 94 respiratory 21 blood pressure 119/70 saturations 95%  Ventilator Settings off the ventilator on T collar FiO2 35%  . General: Comfortable at this time . Eyes: Grossly normal lids, irises & conjunctiva . ENT: grossly tongue is normal . Neck: no obvious mass . Cardiovascular: S1 S2 normal no gallop . Respiratory: No rhonchi or rales are noted at this time . Abdomen: soft . Skin: no rash seen on limited exam . Musculoskeletal: not rigid . Psychiatric:unable to assess . Neurologic: no seizure no involuntary movements         Lab Data:   Basic Metabolic Panel: No results for input(s): NA, K, CL, CO2, GLUCOSE, BUN, CREATININE, CALCIUM, MG, PHOS in the last 168 hours.  ABG: No results for input(s): PHART, PCO2ART, PO2ART, HCO3, O2SAT in the last 168 hours.  Liver Function Tests: No results for input(s): AST, ALT, ALKPHOS, BILITOT, PROT, ALBUMIN in the last 168 hours. No results for input(s): LIPASE, AMYLASE in the last 168 hours. No results for input(s): AMMONIA in the last 168 hours.  CBC: Recent Labs  Lab 07/11/18 0611  WBC 12.3*  NEUTROABS 10.8*  HGB 10.5*  HCT 33.4*  MCV 89.3  PLT 272    Cardiac Enzymes: No results for input(s): CKTOTAL, CKMB, CKMBINDEX, TROPONINI in the last 168 hours.  BNP (last 3 results) No results for input(s): BNP in the last 8760 hours.  ProBNP (last 3 results) No results for input(s): PROBNP in the last 8760  hours.  Radiological Exams: Dg Chest Port 1 View  Result Date: 07/13/2018 CLINICAL DATA:  Chronic respiratory failure with hypoxia. EXAM: PORTABLE CHEST 1 VIEW COMPARISON:  None. FINDINGS: Mild cardiomegaly is noted. Tracheostomy tube is in good position. Hypoinflation of the lungs is noted with mild bibasilar subsegmental atelectasis. No pneumothorax or pleural effusion is noted. The visualized skeletal structures are unremarkable. IMPRESSION: Hypoinflation of the lungs with mild bibasilar subsegmental atelectasis. Electronically Signed   By: Lupita Raider, M.D.   On: 07/13/2018 10:56    Assessment/Plan Active Problems:   Acute on chronic respiratory failure with hypoxia (HCC)   Chronic atrial fibrillation   Community acquired pneumonia, bilateral   Obstructive sleep apnea   Pulmonary hypertension, primary (HCC)   1. Acute on chronic respiratory failure with hypoxia we will continue with T collar trials FiO2 35% patient's goal today is for 12 hours. 2. Chronic atrial fibrillation rate controlled at this time 3. Community-acquired pneumonia treated we will continue to follow 4. Sleep apnea nonissue 5. Pulmonary hypertension on oxygen therapy   I have personally seen and evaluated the patient, evaluated laboratory and imaging results, formulated the assessment and plan and placed orders. The Patient requires high complexity decision making for assessment and support.  Case was discussed on Rounds with the Respiratory Therapy Staff  Yevonne Pax, MD [facility] Pulmonary Critical Care Medicine Sleep Medicine

## 2018-07-14 DIAGNOSIS — J9621 Acute and chronic respiratory failure with hypoxia: Secondary | ICD-10-CM | POA: Diagnosis not present

## 2018-07-14 DIAGNOSIS — I482 Chronic atrial fibrillation, unspecified: Secondary | ICD-10-CM | POA: Diagnosis not present

## 2018-07-14 DIAGNOSIS — J189 Pneumonia, unspecified organism: Secondary | ICD-10-CM | POA: Diagnosis not present

## 2018-07-14 DIAGNOSIS — G4733 Obstructive sleep apnea (adult) (pediatric): Secondary | ICD-10-CM | POA: Diagnosis not present

## 2018-07-14 NOTE — Progress Notes (Signed)
Pulmonary Critical Care Medicine [facility] [place]   PULMONARY CRITICAL CARE SERVICE  PROGRESS NOTE  Date of Service: 07/14/2018  Kristi Leon  OZD:[id number]  DOB: [birth year]   DOA: 07/05/2018  Referring Physician: Carron Curie, MD  HPI: Kristi Leon is a 85 y.o. female seen for follow up of Acute on Chronic Respiratory Failure.  Currently patient is on T collar has been not tolerating the wean fairly well the goal is for 16 hours  Medications: Reviewed on Rounds  Physical Exam:  Vitals: Temperature 98.0 pulse 98 respiratory 24 blood pressure 114/71 saturations 96%  Ventilator Settings on T collar FiO2 35%  . General: Comfortable at this time . Eyes: Grossly normal lids, irises & conjunctiva . ENT: grossly tongue is normal . Neck: no obvious mass . Cardiovascular: S1 S2 normal no gallop . Respiratory: Scattered rhonchi expansion is equal . Abdomen: soft . Skin: no rash seen on limited exam . Musculoskeletal: not rigid . Psychiatric:unable to assess . Neurologic: no seizure no involuntary movements         Lab Data:   Basic Metabolic Panel: No results for input(s): NA, K, CL, CO2, GLUCOSE, BUN, CREATININE, CALCIUM, MG, PHOS in the last 168 hours.  ABG: No results for input(s): PHART, PCO2ART, PO2ART, HCO3, O2SAT in the last 168 hours.  Liver Function Tests: No results for input(s): AST, ALT, ALKPHOS, BILITOT, PROT, ALBUMIN in the last 168 hours. No results for input(s): LIPASE, AMYLASE in the last 168 hours. No results for input(s): AMMONIA in the last 168 hours.  CBC: Recent Labs  Lab 07/11/18 0611  WBC 12.3*  NEUTROABS 10.8*  HGB 10.5*  HCT 33.4*  MCV 89.3  PLT 272    Cardiac Enzymes: No results for input(s): CKTOTAL, CKMB, CKMBINDEX, TROPONINI in the last 168 hours.  BNP (last 3 results) No results for input(s): BNP in the last 8760 hours.  ProBNP (last 3 results) No results for input(s): PROBNP in the last 8760  hours.  Radiological Exams: Dg Chest Port 1 View  Result Date: 07/13/2018 CLINICAL DATA:  Chronic respiratory failure with hypoxia. EXAM: PORTABLE CHEST 1 VIEW COMPARISON:  None. FINDINGS: Mild cardiomegaly is noted. Tracheostomy tube is in good position. Hypoinflation of the lungs is noted with mild bibasilar subsegmental atelectasis. No pneumothorax or pleural effusion is noted. The visualized skeletal structures are unremarkable. IMPRESSION: Hypoinflation of the lungs with mild bibasilar subsegmental atelectasis. Electronically Signed   By: Lupita Raider, M.D.   On: 07/13/2018 10:56    Assessment/Plan Active Problems:   Acute on chronic respiratory failure with hypoxia (HCC)   Chronic atrial fibrillation   Community acquired pneumonia, bilateral   Obstructive sleep apnea   Pulmonary hypertension, primary (HCC)   1. Acute on chronic respiratory failure with hypoxia we will continue with the weaning on T collar continue pulmonary toilet secretion management.  Patient is supposed to do 16 hours today. 2. Chronic atrial fibrillation rate is controlled at this time we will continue present management 3. Community-acquired pneumonia bilateral treated we will follow 4. Obstructive sleep apnea nonissue at this time on the T collar 5. Pulmonary hypertension continue with oxygen therapy supportive care   I have personally seen and evaluated the patient, evaluated laboratory and imaging results, formulated the assessment and plan and placed orders. The Patient requires high complexity decision making for assessment and support.  Case was discussed on Rounds with the Respiratory Therapy Staff  Yevonne Pax, MD [facility] Pulmonary Critical Care Medicine  Sleep Medicine

## 2018-07-15 DIAGNOSIS — J9621 Acute and chronic respiratory failure with hypoxia: Secondary | ICD-10-CM | POA: Diagnosis not present

## 2018-07-15 DIAGNOSIS — J189 Pneumonia, unspecified organism: Secondary | ICD-10-CM | POA: Diagnosis not present

## 2018-07-15 DIAGNOSIS — I482 Chronic atrial fibrillation, unspecified: Secondary | ICD-10-CM | POA: Diagnosis not present

## 2018-07-15 DIAGNOSIS — G4733 Obstructive sleep apnea (adult) (pediatric): Secondary | ICD-10-CM | POA: Diagnosis not present

## 2018-07-15 NOTE — Progress Notes (Signed)
Pulmonary Critical Care Medicine [facility] [place]   PULMONARY CRITICAL CARE SERVICE  PROGRESS NOTE  Date of Service: 07/15/2018  Eather Poepping  [id number]:[id number]  DOB: 11-Sep-1933   DOA: 07/05/2018  Referring Physician: Carron Curie, MD  HPI: Tationna Marandola is a 85 y.o. female seen for follow up of Acute on Chronic Respiratory Failure.  Patient is on T collar weaning with the PMV in place she is doing well goal is 20 hours  Medications: Reviewed on Rounds  Physical Exam:  Vitals: Temperature 97.6 pulse 94 respiratory 21 blood pressure 113/65 saturation 94%  Ventilator Settings off the ventilator on T collar rate now on 35% FiO2  . General: Comfortable at this time . Eyes: Grossly normal lids, irises & conjunctiva . ENT: grossly tongue is normal . Neck: no obvious mass . Cardiovascular: S1 S2 normal no gallop . Respiratory: No rhonchi no rales are noted at this time . Abdomen: soft . Skin: no rash seen on limited exam . Musculoskeletal: not rigid . Psychiatric:unable to assess . Neurologic: no seizure no involuntary movements         Lab Data:   Basic Metabolic Panel: No results for input(s): NA, K, CL, CO2, GLUCOSE, BUN, CREATININE, CALCIUM, MG, PHOS in the last 168 hours.  ABG: No results for input(s): PHART, PCO2ART, PO2ART, HCO3, O2SAT in the last 168 hours.  Liver Function Tests: No results for input(s): AST, ALT, ALKPHOS, BILITOT, PROT, ALBUMIN in the last 168 hours. No results for input(s): LIPASE, AMYLASE in the last 168 hours. No results for input(s): AMMONIA in the last 168 hours.  CBC: Recent Labs  Lab 07/11/18 0611  WBC 12.3*  NEUTROABS 10.8*  HGB 10.5*  HCT 33.4*  MCV 89.3  PLT 272    Cardiac Enzymes: No results for input(s): CKTOTAL, CKMB, CKMBINDEX, TROPONINI in the last 168 hours.  BNP (last 3 results) No results for input(s): BNP in the last 8760 hours.  ProBNP (last 3 results) No results for input(s): PROBNP in the last  8760 hours.  Radiological Exams: No results found.  Assessment/Plan Active Problems:   Acute on chronic respiratory failure with hypoxia (HCC)   Chronic atrial fibrillation   Community acquired pneumonia, bilateral   Obstructive sleep apnea   Pulmonary hypertension, primary (HCC)   1. Acute on chronic respiratory failure with hypoxia we will continue with the wean on T collar goal 20 hours as mentioned 2. Chronic atrial fibrillation rate controlled 3. Community-acquired pneumonia treated clinically improving 4. OSA nonissue 5. Pulmonary hypertension on oxygen therapy continue   I have personally seen and evaluated the patient, evaluated laboratory and imaging results, formulated the assessment and plan and placed orders. The Patient requires high complexity decision making for assessment and support.  Case was discussed on Rounds with the Respiratory Therapy Staff  Yevonne Pax, MD [facility] Pulmonary Critical Care Medicine Sleep Medicine

## 2018-07-16 DIAGNOSIS — J189 Pneumonia, unspecified organism: Secondary | ICD-10-CM | POA: Diagnosis not present

## 2018-07-16 DIAGNOSIS — J9621 Acute and chronic respiratory failure with hypoxia: Secondary | ICD-10-CM | POA: Diagnosis not present

## 2018-07-16 DIAGNOSIS — G4733 Obstructive sleep apnea (adult) (pediatric): Secondary | ICD-10-CM | POA: Diagnosis not present

## 2018-07-16 DIAGNOSIS — I482 Chronic atrial fibrillation, unspecified: Secondary | ICD-10-CM | POA: Diagnosis not present

## 2018-07-16 LAB — CULTURE, RESPIRATORY W GRAM STAIN

## 2018-07-16 NOTE — Progress Notes (Signed)
Pulmonary Critical Care Medicine [facility] [place]   PULMONARY CRITICAL CARE SERVICE  PROGRESS NOTE  Date of Service: 07/16/2018  Kristi Leon  OFB:[id number]  DOB: August 13, 1933   DOA: 07/05/2018  Referring Physician: Carron Curie, MD  HPI: Kristi Leon is a 85 y.o. female seen for follow up of Acute on Chronic Respiratory Failure.  Patient is weaning right now on 30% FiO2 on the T collar  Medications: Reviewed on Rounds  Physical Exam:  Vitals: Temperature 98.6 pulse 90 respiratory 20 blood pressure 114/70 saturation 96%  Ventilator Settings T collar FiO2 30%  . General: Comfortable at this time . Eyes: Grossly normal lids, irises & conjunctiva . ENT: grossly tongue is normal . Neck: no obvious mass . Cardiovascular: S1 S2 normal no gallop . Respiratory: Scattered rhonchi expansion is equal . Abdomen: soft . Skin: no rash seen on limited exam . Musculoskeletal: not rigid . Psychiatric:unable to assess . Neurologic: no seizure no involuntary movements         Lab Data:   Basic Metabolic Panel: No results for input(s): NA, K, CL, CO2, GLUCOSE, BUN, CREATININE, CALCIUM, MG, PHOS in the last 168 hours.  ABG: No results for input(s): PHART, PCO2ART, PO2ART, HCO3, O2SAT in the last 168 hours.  Liver Function Tests: No results for input(s): AST, ALT, ALKPHOS, BILITOT, PROT, ALBUMIN in the last 168 hours. No results for input(s): LIPASE, AMYLASE in the last 168 hours. No results for input(s): AMMONIA in the last 168 hours.  CBC: Recent Labs  Lab 07/11/18 0611  WBC 12.3*  NEUTROABS 10.8*  HGB 10.5*  HCT 33.4*  MCV 89.3  PLT 272    Cardiac Enzymes: No results for input(s): CKTOTAL, CKMB, CKMBINDEX, TROPONINI in the last 168 hours.  BNP (last 3 results) No results for input(s): BNP in the last 8760 hours.  ProBNP (last 3 results) No results for input(s): PROBNP in the last 8760 hours.  Radiological Exams: No results  found.  Assessment/Plan Active Problems:   Acute on chronic respiratory failure with hypoxia (HCC)   Chronic atrial fibrillation   Community acquired pneumonia, bilateral   Obstructive sleep apnea   Pulmonary hypertension, primary (HCC)   1. Acute on chronic respiratory failure with hypoxia we will continue to wean on T collar titrate oxygen continue pulmonary toilet. 2. Chronic atrial fibrillation rate controlled 3. Community-acquired pneumonia treated we will continue supportive care 4. OSA treated via tracheostomy nonissue 5. Pulmonary hypertension on oxygen   I have personally seen and evaluated the patient, evaluated laboratory and imaging results, formulated the assessment and plan and placed orders. The Patient requires high complexity decision making for assessment and support.  Case was discussed on Rounds with the Respiratory Therapy Staff  Yevonne Pax, MD [facility] Pulmonary Critical Care Medicine Sleep Medicine

## 2018-07-17 DIAGNOSIS — G4733 Obstructive sleep apnea (adult) (pediatric): Secondary | ICD-10-CM | POA: Diagnosis not present

## 2018-07-17 DIAGNOSIS — J9621 Acute and chronic respiratory failure with hypoxia: Secondary | ICD-10-CM | POA: Diagnosis not present

## 2018-07-17 DIAGNOSIS — J189 Pneumonia, unspecified organism: Secondary | ICD-10-CM | POA: Diagnosis not present

## 2018-07-17 DIAGNOSIS — I482 Chronic atrial fibrillation, unspecified: Secondary | ICD-10-CM | POA: Diagnosis not present

## 2018-07-17 NOTE — Progress Notes (Addendum)
[facility] [place]   PULMONARY CRITICAL CARE SERVICE  PROGRESS NOTE  Date of Service: 07/17/2018  Kristi Reelse Tang Fitzner  ZOX:[id number]  DOB: 1933/09/23   DOA: 07/05/2018  Referring Physician: Carron CurieAli Hijazi, MD  HPI: Kristi Leon is a 85 y.o. female seen for follow up of Acute on Chronic Respiratory Failure.  Patient continues to wean on trach collar FiO2 28% at this time.  Patient is using PMV without difficulty.  Minimal secretions noted.   Medications: Reviewed on Rounds  Physical Exam:  Vitals: Pulse 99 respirations 16 BP 114/66 O2 sat 97% temp 98.3  Ventilator Settings patient's not currently on ventilator  . General: Comfortable at this time . Eyes: Grossly normal lids, irises & conjunctiva . ENT: grossly tongue is normal . Neck: no obvious mass . Cardiovascular: S1 S2 normal no gallop . Respiratory: Coarse sounds . Abdomen: soft . Skin: no rash seen on limited exam . Musculoskeletal: not rigid . Psychiatric:unable to assess . Neurologic: no seizure no involuntary movements         Lab Data:   Basic Metabolic Panel: No results for input(s): NA, K, CL, CO2, GLUCOSE, BUN, CREATININE, CALCIUM, MG, PHOS in the last 168 hours.  ABG: No results for input(s): PHART, PCO2ART, PO2ART, HCO3, O2SAT in the last 168 hours.  Liver Function Tests: No results for input(s): AST, ALT, ALKPHOS, BILITOT, PROT, ALBUMIN in the last 168 hours. No results for input(s): LIPASE, AMYLASE in the last 168 hours. No results for input(s): AMMONIA in the last 168 hours.  CBC: Recent Labs  Lab 07/11/18 0611  WBC 12.3*  NEUTROABS 10.8*  HGB 10.5*  HCT 33.4*  MCV 89.3  PLT 272    Cardiac Enzymes: No results for input(s): CKTOTAL, CKMB, CKMBINDEX, TROPONINI in the last 168 hours.  BNP (last 3 results) No results for input(s): BNP in the last 8760 hours.  ProBNP (last 3 results) No results for input(s): PROBNP in the last 8760  hours.  Radiological Exams: No results found.  Assessment/Plan Active Problems:   Acute on chronic respiratory failure with hypoxia (HCC)   Chronic atrial fibrillation   Community acquired pneumonia, bilateral   Obstructive sleep apnea   Pulmonary hypertension, primary (HCC)   1. Acute on chronic story failure with hypoxia continue to wean per protocol as tolerated.  Can continue aggressive pulmonary toilet. 2. Chronic atrial fibrillation rate controlled 3. Community-acquired pneumonia treated continue supportive care 4. OSA treated via tracheostomy no issues 5. Pulmonary hypertension on oxygen   I have personally seen and evaluated the patient, evaluated laboratory and imaging results, formulated the assessment and plan and placed orders. The Patient requires high complexity decision making for assessment and support.  Case was discussed on Rounds with the Respiratory Therapy Staff  Yevonne PaxSaadat A , MD [facility] Pulmonary Critical Care Medicine Sleep Medicine

## 2018-07-18 DIAGNOSIS — G4733 Obstructive sleep apnea (adult) (pediatric): Secondary | ICD-10-CM | POA: Diagnosis not present

## 2018-07-18 DIAGNOSIS — J9621 Acute and chronic respiratory failure with hypoxia: Secondary | ICD-10-CM | POA: Diagnosis not present

## 2018-07-18 DIAGNOSIS — I482 Chronic atrial fibrillation, unspecified: Secondary | ICD-10-CM | POA: Diagnosis not present

## 2018-07-18 DIAGNOSIS — J189 Pneumonia, unspecified organism: Secondary | ICD-10-CM | POA: Diagnosis not present

## 2018-07-18 NOTE — Progress Notes (Addendum)
Pulmonary Critical Care Medicine [facility] [place]   PULMONARY CRITICAL CARE SERVICE  PROGRESS NOTE  Date of Service: 07/18/2018  Kristi Leon  AXK:[id number]  DOB: [birth year]   DOA: 07/05/2018  Referring Physician: Carron Curie, MD  HPI: Kristi Leon is a 85 y.o. female seen for follow up of Acute on Chronic Respiratory Failure.  Patient doing well on trach collar 28% FiO2.  Using Passy-Muir valve without difficulty with minimal secretions.  Janina Mayo is changed today to a 5 XLT cuffless and capping trials may begin.  Medications: Reviewed on Rounds  Physical Exam:  Vitals: Pulse 81 respirations 18 BP 101/41 O2 sat 96% temp 97.4  Ventilator Settings not currently on ventilator  . General: Comfortable at this time . Eyes: Grossly normal lids, irises & conjunctiva . ENT: grossly tongue is normal . Neck: no obvious mass . Cardiovascular: S1 S2 normal no gallop . Respiratory: Coarse breath sounds . Abdomen: soft . Skin: no rash seen on limited exam . Musculoskeletal: not rigid . Psychiatric:unable to assess . Neurologic: no seizure no involuntary movements         Lab Data:   Basic Metabolic Panel: No results for input(s): NA, K, CL, CO2, GLUCOSE, BUN, CREATININE, CALCIUM, MG, PHOS in the last 168 hours.  ABG: No results for input(s): PHART, PCO2ART, PO2ART, HCO3, O2SAT in the last 168 hours.  Liver Function Tests: No results for input(s): AST, ALT, ALKPHOS, BILITOT, PROT, ALBUMIN in the last 168 hours. No results for input(s): LIPASE, AMYLASE in the last 168 hours. No results for input(s): AMMONIA in the last 168 hours.  CBC: No results for input(s): WBC, NEUTROABS, HGB, HCT, MCV, PLT in the last 168 hours.  Cardiac Enzymes: No results for input(s): CKTOTAL, CKMB, CKMBINDEX, TROPONINI in the last 168 hours.  BNP (last 3 results) No results for input(s): BNP in the last 8760 hours.  ProBNP (last 3 results) No results for input(s): PROBNP in the  last 8760 hours.  Radiological Exams: No results found.  Assessment/Plan Active Problems:   Acute on chronic respiratory failure with hypoxia (HCC)   Chronic atrial fibrillation   Community acquired pneumonia, bilateral   Obstructive sleep apnea   Pulmonary hypertension, primary (HCC)   1. Acute on chronic respiratory failure with hypoxia continue weaning per protocol as tolerated.  Continue aggressive pulmonary toilet and secretion management.  Change trach to #5 XLT cuffless and begin capping trials. 2. Chronic atrial fibrillation rate controlled 3. Community-acquired pneumonia treated continue supportive care 4. OSA treated via tracheostomy no issues 5. Pulmonary hypertension on oxygen   I have personally seen and evaluated the patient, evaluated laboratory and imaging results, formulated the assessment and plan and placed orders. The Patient requires high complexity decision making for assessment and support.  Case was discussed on Rounds with the Respiratory Therapy Staff  Yevonne Pax, MD [facility] Pulmonary Critical Care Medicine Sleep Medicine

## 2018-07-19 DIAGNOSIS — J189 Pneumonia, unspecified organism: Secondary | ICD-10-CM | POA: Diagnosis not present

## 2018-07-19 DIAGNOSIS — G4733 Obstructive sleep apnea (adult) (pediatric): Secondary | ICD-10-CM | POA: Diagnosis not present

## 2018-07-19 DIAGNOSIS — J9621 Acute and chronic respiratory failure with hypoxia: Secondary | ICD-10-CM | POA: Diagnosis not present

## 2018-07-19 DIAGNOSIS — I482 Chronic atrial fibrillation, unspecified: Secondary | ICD-10-CM | POA: Diagnosis not present

## 2018-07-19 NOTE — Progress Notes (Signed)
Pulmonary Critical Care Medicine [facility] [place]   PULMONARY CRITICAL CARE SERVICE  PROGRESS NOTE  Date of Service: 07/19/2018  Kristi Leon  FVC:[id number]  DOB: [birth year]   DOA: 07/05/2018  Referring Physician: Carron Curie, MD  HPI: Kristi Leon is a 85 y.o. female seen for follow up of Acute on Chronic Respiratory Failure.  Patient is capping right now has been on 2 L good saturations are noted.  Medications: Reviewed on Rounds  Physical Exam:  Vitals: Temperature 97.2 pulse 85 respiratory days 18 blood pressure 92/33 saturations 95%  Ventilator Settings off the ventilator capping at this time  . General: Comfortable at this time . Eyes: Grossly normal lids, irises & conjunctiva . ENT: grossly tongue is normal . Neck: no obvious mass . Cardiovascular: S1 S2 normal no gallop . Respiratory: No rhonchi or rales are noted . Abdomen: soft . Skin: no rash seen on limited exam . Musculoskeletal: not rigid . Psychiatric:unable to assess . Neurologic: no seizure no involuntary movements         Lab Data:   Basic Metabolic Panel: No results for input(s): NA, K, CL, CO2, GLUCOSE, BUN, CREATININE, CALCIUM, MG, PHOS in the last 168 hours.  ABG: No results for input(s): PHART, PCO2ART, PO2ART, HCO3, O2SAT in the last 168 hours.  Liver Function Tests: No results for input(s): AST, ALT, ALKPHOS, BILITOT, PROT, ALBUMIN in the last 168 hours. No results for input(s): LIPASE, AMYLASE in the last 168 hours. No results for input(s): AMMONIA in the last 168 hours.  CBC: No results for input(s): WBC, NEUTROABS, HGB, HCT, MCV, PLT in the last 168 hours.  Cardiac Enzymes: No results for input(s): CKTOTAL, CKMB, CKMBINDEX, TROPONINI in the last 168 hours.  BNP (last 3 results) No results for input(s): BNP in the last 8760 hours.  ProBNP (last 3 results) No results for input(s): PROBNP in the last 8760 hours.  Radiological Exams: No results  found.  Assessment/Plan Active Problems:   Acute on chronic respiratory failure with hypoxia (HCC)   Chronic atrial fibrillation   Community acquired pneumonia, bilateral   Obstructive sleep apnea   Pulmonary hypertension, primary (HCC)   1. Acute on chronic respiratory failure with hypoxia we will continue with the capping trials titrate oxygen continue pulmonary toilet 2. Chronic atrial fibrillation rate controlled 3. Community-acquired pneumonia treated clinically improving 4. OSA has trach in place need to monitor 5. Pulmonary hypertension on oxygen treated   I have personally seen and evaluated the patient, evaluated laboratory and imaging results, formulated the assessment and plan and placed orders. The Patient requires high complexity decision making for assessment and support.  Case was discussed on Rounds with the Respiratory Therapy Staff  Yevonne Pax, MD [facility] Pulmonary Critical Care Medicine Sleep Medicine

## 2018-07-20 ENCOUNTER — Other Ambulatory Visit (HOSPITAL_COMMUNITY): Payer: Medicare Other

## 2018-07-20 DIAGNOSIS — J9621 Acute and chronic respiratory failure with hypoxia: Secondary | ICD-10-CM | POA: Diagnosis not present

## 2018-07-20 DIAGNOSIS — I482 Chronic atrial fibrillation, unspecified: Secondary | ICD-10-CM | POA: Diagnosis not present

## 2018-07-20 DIAGNOSIS — G4733 Obstructive sleep apnea (adult) (pediatric): Secondary | ICD-10-CM | POA: Diagnosis not present

## 2018-07-20 DIAGNOSIS — J189 Pneumonia, unspecified organism: Secondary | ICD-10-CM | POA: Diagnosis not present

## 2018-07-20 NOTE — Progress Notes (Signed)
[facility] [place]   PULMONARY CRITICAL CARE SERVICE  PROGRESS NOTE  Date of Service: 07/20/2018  Kristi Leon  ZOX:[id number]  DOB: 10/02/1933   DOA: 07/05/2018  Referring Physician: Carron CurieAli Hijazi, MD  HPI: Kristi Reelse Tang Toft is a 85 y.o. female seen for follow up of Acute on Chronic Respiratory Failure.  Currently is capping without distress patient's been on 2 L today the goal should be 48 hours  Medications: Reviewed on Rounds  Physical Exam:  Vitals: Temperature 97.2 pulse 80 respiratory 18 blood pressure 91/70 saturations 96%  Ventilator Settings off the ventilator capping at this time  . General: Comfortable at this time . Eyes: Grossly normal lids, irises & conjunctiva . ENT: grossly tongue is normal . Neck: no obvious mass . Cardiovascular: S1 S2 normal no gallop . Respiratory: Scattered rhonchi expansion is equal . Abdomen: soft . Skin: no rash seen on limited exam . Musculoskeletal: not rigid . Psychiatric:unable to assess . Neurologic: no seizure no involuntary movements         Lab Data:   Basic Metabolic Panel: No results for input(s): NA, K, CL, CO2, GLUCOSE, BUN, CREATININE, CALCIUM, MG, PHOS in the last 168 hours.  ABG: No results for input(s): PHART, PCO2ART, PO2ART, HCO3, O2SAT in the last 168 hours.  Liver Function Tests: No results for input(s): AST, ALT, ALKPHOS, BILITOT, PROT, ALBUMIN in the last 168 hours. No results for input(s): LIPASE, AMYLASE in the last 168 hours. No results for input(s): AMMONIA in the last 168 hours.  CBC: No results for input(s): WBC, NEUTROABS, HGB, HCT, MCV, PLT in the last 168 hours.  Cardiac Enzymes: No results for input(s): CKTOTAL, CKMB, CKMBINDEX, TROPONINI in the last 168 hours.  BNP (last 3 results) No results for input(s): BNP in the last 8760 hours.  ProBNP (last 3 results) No results for input(s): PROBNP in the last 8760 hours.  Radiological Exams: No  results found.  Assessment/Plan Active Problems:   Acute on chronic respiratory failure with hypoxia (HCC)   Chronic atrial fibrillation   Community acquired pneumonia, bilateral   Obstructive sleep apnea   Pulmonary hypertension, primary (HCC)   1. Acute on chronic respiratory failure with hypoxia we will continue with capping trials titrate oxygen continue pulmonary toilet 2. Chronic atrial fibrillation rate controlled continue to monitor 3. Community-acquired pneumonia treated follow-up radiologically 4. Obstructive sleep apnea right now nonissue will need to monitor once ready to decannulate 5. Pulmonary hypertension on oxygen therapy supportive care   I have personally seen and evaluated the patient, evaluated laboratory and imaging results, formulated the assessment and plan and placed orders. The Patient requires high complexity decision making for assessment and support.  Case was discussed on Rounds with the Respiratory Therapy Staff  Yevonne PaxSaadat A Khan, MD [facility] Pulmonary Critical Care Medicine Sleep Medicine

## 2018-07-21 DIAGNOSIS — I482 Chronic atrial fibrillation, unspecified: Secondary | ICD-10-CM | POA: Diagnosis not present

## 2018-07-21 DIAGNOSIS — J9621 Acute and chronic respiratory failure with hypoxia: Secondary | ICD-10-CM | POA: Diagnosis not present

## 2018-07-21 DIAGNOSIS — G4733 Obstructive sleep apnea (adult) (pediatric): Secondary | ICD-10-CM | POA: Diagnosis not present

## 2018-07-21 DIAGNOSIS — J189 Pneumonia, unspecified organism: Secondary | ICD-10-CM | POA: Diagnosis not present

## 2018-07-21 LAB — CBC
HCT: 37.6 % (ref 36.0–46.0)
Hemoglobin: 12.2 g/dL (ref 12.0–15.0)
MCH: 29 pg (ref 26.0–34.0)
MCHC: 32.4 g/dL (ref 30.0–36.0)
MCV: 89.5 fL (ref 80.0–100.0)
Platelets: 170 10*3/uL (ref 150–400)
RBC: 4.2 MIL/uL (ref 3.87–5.11)
RDW: 14.7 % (ref 11.5–15.5)
WBC: 15.3 10*3/uL — ABNORMAL HIGH (ref 4.0–10.5)
nRBC: 0 % (ref 0.0–0.2)

## 2018-07-21 LAB — BASIC METABOLIC PANEL
Anion gap: 8 (ref 5–15)
BUN: 65 mg/dL — ABNORMAL HIGH (ref 8–23)
CHLORIDE: 102 mmol/L (ref 98–111)
CO2: 30 mmol/L (ref 22–32)
Calcium: 8.4 mg/dL — ABNORMAL LOW (ref 8.9–10.3)
Creatinine, Ser: 1.11 mg/dL — ABNORMAL HIGH (ref 0.44–1.00)
GFR calc Af Amer: 54 mL/min — ABNORMAL LOW (ref >60)
GFR calc non Af Amer: 46 mL/min — ABNORMAL LOW (ref >60)
Glucose, Bld: 104 mg/dL — ABNORMAL HIGH (ref 70–99)
Potassium: 4 mmol/L (ref 3.5–5.1)
Sodium: 140 mmol/L (ref 135–145)

## 2018-07-21 NOTE — Progress Notes (Addendum)
Pulmonary Critical Care Medicine [facility] [place]   PULMONARY CRITICAL CARE SERVICE  PROGRESS NOTE  Date of Service: 07/21/2018  Kristi Leon  WYO:[id number]  DOB: 1934-05-08   DOA: 07/05/2018  Referring Physician: Carron Curie, MD  HPI: Kristi Leon is a 85 y.o. female seen for follow up of Acute on Chronic Respiratory Failure.  Patient has reached 48-hour goal with capping.  She currently uses 2 L of oxygen via nasal cannula when capped.  She appears in no acute distress and is tolerating capping well.  Medications: Reviewed on Rounds  Physical Exam:  Vitals: Pulse 87 respirations 16 BP 111/71 O2 sat 100% temp 97 4  Ventilator Settings patient's not only on ventilator  . General: Comfortable at this time . Eyes: Grossly normal lids, irises & conjunctiva . ENT: grossly tongue is normal . Neck: no obvious mass . Cardiovascular: S1 S2 normal no gallop . Respiratory: Scattered rhonchi . Abdomen: soft . Skin: no rash seen on limited exam . Musculoskeletal: not rigid . Psychiatric:unable to assess . Neurologic: no seizure no involuntary movements         Lab Data:   Basic Metabolic Panel: Recent Labs  Lab 07/21/18 0515  NA 140  K 4.0  CL 102  CO2 30  GLUCOSE 104*  BUN 65*  CREATININE 1.11*  CALCIUM 8.4*    ABG: No results for input(s): PHART, PCO2ART, PO2ART, HCO3, O2SAT in the last 168 hours.  Liver Function Tests: No results for input(s): AST, ALT, ALKPHOS, BILITOT, PROT, ALBUMIN in the last 168 hours. No results for input(s): LIPASE, AMYLASE in the last 168 hours. No results for input(s): AMMONIA in the last 168 hours.  CBC: Recent Labs  Lab 07/21/18 0515  WBC 15.3*  HGB 12.2  HCT 37.6  MCV 89.5  PLT 170    Cardiac Enzymes: No results for input(s): CKTOTAL, CKMB, CKMBINDEX, TROPONINI in the last 168 hours.  BNP (last 3 results) No results for input(s): BNP in the last 8760 hours.  ProBNP (last 3 results) No results for  input(s): PROBNP in the last 8760 hours.  Radiological Exams: No results found.  Assessment/Plan Active Problems:   Acute on chronic respiratory failure with hypoxia (HCC)   Chronic atrial fibrillation   Community acquired pneumonia, bilateral   Obstructive sleep apnea   Pulmonary hypertension, primary (HCC)   1. Acute on chronic respiratory care with hypoxia continue with capping trials as tolerated.  Continue to titrate oxygen and continue aggressive pulmonary toilet. 2. Chronic atrial fibrillation rate controlled continue to monitor 3. Community-acquired pneumonia treated follow-up radiologically 4. Obstructive sleep apnea nonissue will monitor once decannulated 5. Pulmonary hypertension on oxygen therapy continue supportive care   I have personally seen and evaluated the patient, evaluated laboratory and imaging results, formulated the assessment and plan and placed orders. The Patient requires high complexity decision making for assessment and support.  Case was discussed on Rounds with the Respiratory Therapy Staff  Yevonne Pax, MD [facility] Pulmonary Critical Care Medicine Sleep Medicine

## 2018-07-22 DIAGNOSIS — G4733 Obstructive sleep apnea (adult) (pediatric): Secondary | ICD-10-CM | POA: Diagnosis not present

## 2018-07-22 DIAGNOSIS — I482 Chronic atrial fibrillation, unspecified: Secondary | ICD-10-CM | POA: Diagnosis not present

## 2018-07-22 DIAGNOSIS — J189 Pneumonia, unspecified organism: Secondary | ICD-10-CM | POA: Diagnosis not present

## 2018-07-22 DIAGNOSIS — J9621 Acute and chronic respiratory failure with hypoxia: Secondary | ICD-10-CM | POA: Diagnosis not present

## 2018-07-22 NOTE — Progress Notes (Addendum)
Pulmonary Critical Care Medicine [facility] [place]   PULMONARY CRITICAL CARE SERVICE  PROGRESS NOTE  Date of Service: 07/22/2018  Kristi Leon  WUJ:[id number]  DOB: [birth year]   DOA: 07/05/2018  Referring Physician: Carron Curie, MD  HPI: Kristi Leon is a 85 y.o. female seen for follow up of Acute on Chronic Respiratory Failure.  Patient continues to do well with capping.  She was reduced from 2 L of oxygen to room air today without difficulty.  Secretions remain minimal.  Medications: Reviewed on Rounds  Physical Exam:  Vitals: Pulse 98 respirations 18 BP 118/59 O2 sat 97% temp 97.8  Ventilator Settings not currently on ventilator  . General: Comfortable at this time . Eyes: Grossly normal lids, irises & conjunctiva . ENT: grossly tongue is normal . Neck: no obvious mass . Cardiovascular: S1 S2 normal no gallop . Respiratory: No rales or rhonchi noted . Abdomen: soft . Skin: no rash seen on limited exam . Musculoskeletal: not rigid . Psychiatric:unable to assess . Neurologic: no seizure no involuntary movements         Lab Data:   Basic Metabolic Panel: Recent Labs  Lab 07/21/18 0515  NA 140  K 4.0  CL 102  CO2 30  GLUCOSE 104*  BUN 65*  CREATININE 1.11*  CALCIUM 8.4*    ABG: No results for input(s): PHART, PCO2ART, PO2ART, HCO3, O2SAT in the last 168 hours.  Liver Function Tests: No results for input(s): AST, ALT, ALKPHOS, BILITOT, PROT, ALBUMIN in the last 168 hours. No results for input(s): LIPASE, AMYLASE in the last 168 hours. No results for input(s): AMMONIA in the last 168 hours.  CBC: Recent Labs  Lab 07/21/18 0515  WBC 15.3*  HGB 12.2  HCT 37.6  MCV 89.5  PLT 170    Cardiac Enzymes: No results for input(s): CKTOTAL, CKMB, CKMBINDEX, TROPONINI in the last 168 hours.  BNP (last 3 results) No results for input(s): BNP in the last 8760 hours.  ProBNP (last 3 results) No results for input(s): PROBNP in the last  8760 hours.  Radiological Exams: No results found.  Assessment/Plan Active Problems:   Acute on chronic respiratory failure with hypoxia (HCC)   Chronic atrial fibrillation   Community acquired pneumonia, bilateral   Obstructive sleep apnea   Pulmonary hypertension, primary (HCC)   1. Acute on chronic respiratory failure with hypoxia continue capping trials as tolerated..  Patient on room air at this time.  Continue aggressive pulmonary toilet and supportive measures. 2. Chronic atrial fibrillation rate controlled continue to monitor 3. Community-acquired pneumonia treated follow-up radiologically 4. Obstructive sleep apnea nonissue will monitor once decannulated 5. Pulmonary hypertension on oxygen therapy continue supportive care   I have personally seen and evaluated the patient, evaluated laboratory and imaging results, formulated the assessment and plan and placed orders. The Patient requires high complexity decision making for assessment and support.  Case was discussed on Rounds with the Respiratory Therapy Staff  Yevonne Pax, MD [facility] Pulmonary Critical Care Medicine Sleep Medicine

## 2018-07-23 DIAGNOSIS — G4733 Obstructive sleep apnea (adult) (pediatric): Secondary | ICD-10-CM | POA: Diagnosis not present

## 2018-07-23 DIAGNOSIS — J189 Pneumonia, unspecified organism: Secondary | ICD-10-CM | POA: Diagnosis not present

## 2018-07-23 DIAGNOSIS — I482 Chronic atrial fibrillation, unspecified: Secondary | ICD-10-CM | POA: Diagnosis not present

## 2018-07-23 DIAGNOSIS — J9621 Acute and chronic respiratory failure with hypoxia: Secondary | ICD-10-CM | POA: Diagnosis not present

## 2018-07-23 NOTE — Progress Notes (Addendum)
Pulmonary Critical Care Medicine [facility] [place]   PULMONARY CRITICAL CARE SERVICE  PROGRESS NOTE  Date of Service: 07/23/2018  Kristi Leon  [id number]  DOB: 02/05/1934   DOA: 07/05/2018  Referring Physician: Carron Curie, MD  HPI: Kristi Leon is a 85 y.o. female seen for follow up of Acute on Chronic Respiratory Failure.  Patient continues to do well been capped.  However she did have some desaturations noted while sleeping down to a low of 82%.  She was placed on 1 L and her sats returned to normal.  She is currently on room air at this time.  Medications: Reviewed on Rounds  Physical Exam:  Vitals: Pulse 88 respirations 24 BP 107/65 O2 sat 98% temp 97.5  Ventilator Settings patient's not currently on ventilator  . General: Comfortable at this time . Eyes: Grossly normal lids, irises & conjunctiva . ENT: grossly tongue is normal . Neck: no obvious mass . Cardiovascular: S1 S2 normal no gallop . Respiratory: No rales or rhonchi noted . Abdomen: soft . Skin: no rash seen on limited exam . Musculoskeletal: not rigid . Psychiatric:unable to assess . Neurologic: no seizure no involuntary movements         Lab Data:   Basic Metabolic Panel: Recent Labs  Lab 07/21/18 0515  NA 140  K 4.0  CL 102  CO2 30  GLUCOSE 104*  BUN 65*  CREATININE 1.11*  CALCIUM 8.4*    ABG: No results for input(s): PHART, PCO2ART, PO2ART, HCO3, O2SAT in the last 168 hours.  Liver Function Tests: No results for input(s): AST, ALT, ALKPHOS, BILITOT, PROT, ALBUMIN in the last 168 hours. No results for input(s): LIPASE, AMYLASE in the last 168 hours. No results for input(s): AMMONIA in the last 168 hours.  CBC: Recent Labs  Lab 07/21/18 0515  WBC 15.3*  HGB 12.2  HCT 37.6  MCV 89.5  PLT 170    Cardiac Enzymes: No results for input(s): CKTOTAL, CKMB, CKMBINDEX, TROPONINI in the last 168 hours.  BNP (last 3 results) No results for input(s): BNP in the  last 8760 hours.  ProBNP (last 3 results) No results for input(s): PROBNP in the last 8760 hours.  Radiological Exams: No results found.  Assessment/Plan Active Problems:   Acute on chronic respiratory failure with hypoxia (HCC)   Chronic atrial fibrillation   Community acquired pneumonia, bilateral   Obstructive sleep apnea   Pulmonary hypertension, primary (HCC)   1. Acute on chronic respiratory failure with hypoxia continue capping trials as tolerated.  Patient on room air at this time however did have a desat while sleeping and required 1 L of oxygen to return her sats normal.  Continue aggressive pulmonary toilet and supportive measures. 2. Chronic atrial fibrillation rate controlled continue to monitor 3. Community-acquired pneumonia treated follow-up radiologically 4. Obstructive sleep apnea, most likely the cause of nocturnal desaturation.  Continue to monitor. 5. Pulmonary hypertension on oxygen therapy continue supportive care   I have personally seen and evaluated the patient, evaluated laboratory and imaging results, formulated the assessment and plan and placed orders. The Patient requires high complexity decision making for assessment and support.  Case was discussed on Rounds with the Respiratory Therapy Staff  Yevonne Pax, MD [facility] Pulmonary Critical Care Medicine Sleep Medicine

## 2018-07-24 DIAGNOSIS — J9621 Acute and chronic respiratory failure with hypoxia: Secondary | ICD-10-CM | POA: Diagnosis not present

## 2018-07-24 DIAGNOSIS — I482 Chronic atrial fibrillation, unspecified: Secondary | ICD-10-CM | POA: Diagnosis not present

## 2018-07-24 DIAGNOSIS — J189 Pneumonia, unspecified organism: Secondary | ICD-10-CM | POA: Diagnosis not present

## 2018-07-24 DIAGNOSIS — G4733 Obstructive sleep apnea (adult) (pediatric): Secondary | ICD-10-CM | POA: Diagnosis not present

## 2018-07-24 LAB — URINALYSIS, ROUTINE W REFLEX MICROSCOPIC
Bilirubin Urine: NEGATIVE
GLUCOSE, UA: NEGATIVE mg/dL
Ketones, ur: NEGATIVE mg/dL
Nitrite: NEGATIVE
PROTEIN: NEGATIVE mg/dL
Specific Gravity, Urine: 1.011 (ref 1.005–1.030)
pH: 7 (ref 5.0–8.0)

## 2018-07-24 NOTE — Progress Notes (Addendum)
Pulmonary Critical Care Medicine [facility] [place]   PULMONARY CRITICAL CARE SERVICE  PROGRESS NOTE  Date of Service: 07/24/2018  Kristi Leon  PFX:[id number]  DOB: Sep 15, 1933   DOA: 07/05/2018  Referring Physician: Carron Curie, MD  HPI: Kristi Leon is a 85 y.o. female seen for follow up of Acute on Chronic Respiratory Failure.  Patient remains capped at this time on room air.  No acute is noted at this time.  Medications: Reviewed on Rounds  Physical Exam:  Vitals: Pulse 97 respirations 25 to be 121/65 O2 sat 90% temp 97.4  Ventilator Settings patient's not currently on ventilator  . General: Comfortable at this time . Eyes: Grossly normal lids, irises & conjunctiva . ENT: grossly tongue is normal . Neck: no obvious mass . Cardiovascular: S1 S2 normal no gallop . Respiratory: No rales or rhonchi noted . Abdomen: soft . Skin: no rash seen on limited exam . Musculoskeletal: not rigid . Psychiatric:unable to assess . Neurologic: no seizure no involuntary movements         Lab Data:   Basic Metabolic Panel: Recent Labs  Lab 07/21/18 0515  NA 140  K 4.0  CL 102  CO2 30  GLUCOSE 104*  BUN 65*  CREATININE 1.11*  CALCIUM 8.4*    ABG: No results for input(s): PHART, PCO2ART, PO2ART, HCO3, O2SAT in the last 168 hours.  Liver Function Tests: No results for input(s): AST, ALT, ALKPHOS, BILITOT, PROT, ALBUMIN in the last 168 hours. No results for input(s): LIPASE, AMYLASE in the last 168 hours. No results for input(s): AMMONIA in the last 168 hours.  CBC: Recent Labs  Lab 07/21/18 0515  WBC 15.3*  HGB 12.2  HCT 37.6  MCV 89.5  PLT 170    Cardiac Enzymes: No results for input(s): CKTOTAL, CKMB, CKMBINDEX, TROPONINI in the last 168 hours.  BNP (last 3 results) No results for input(s): BNP in the last 8760 hours.  ProBNP (last 3 results) No results for input(s): PROBNP in the last 8760 hours.  Radiological Exams: No results  found.  Assessment/Plan Active Problems:   Acute on chronic respiratory failure with hypoxia (HCC)   Chronic atrial fibrillation   Community acquired pneumonia, bilateral   Obstructive sleep apnea   Pulmonary hypertension, primary (HCC)   1. Acute on chronic respiratory failure with hypoxia continue capping trials as tolerated.  Patient is on room air at this time and doing well.  Continue aggressive pulmonary toilet and supportive measures. 2. Chronic atrial relation rate controlled continue to monitor 3. Community-acquired pneumonia treated follow-up radiologically 4. Obstructive sleep apnea, most likely cause of nocturnal saturation continue to monitor 5. Pulmonary attention on oxygen therapy continue supportive care   I have personally seen and evaluated the patient, evaluated laboratory and imaging results, formulated the assessment and plan and placed orders. The Patient requires high complexity decision making for assessment and support.  Case was discussed on Rounds with the Respiratory Therapy Staff  Yevonne Pax, MD [facility] Pulmonary Critical Care Medicine Sleep Medicine

## 2018-07-25 DIAGNOSIS — J189 Pneumonia, unspecified organism: Secondary | ICD-10-CM | POA: Diagnosis not present

## 2018-07-25 DIAGNOSIS — J9621 Acute and chronic respiratory failure with hypoxia: Secondary | ICD-10-CM | POA: Diagnosis not present

## 2018-07-25 DIAGNOSIS — G4733 Obstructive sleep apnea (adult) (pediatric): Secondary | ICD-10-CM | POA: Diagnosis not present

## 2018-07-25 DIAGNOSIS — I482 Chronic atrial fibrillation, unspecified: Secondary | ICD-10-CM | POA: Diagnosis not present

## 2018-07-25 NOTE — Progress Notes (Addendum)
Pulmonary Critical Care Medicine [facility] [place]   PULMONARY CRITICAL CARE SERVICE  PROGRESS NOTE  Date of Service: 07/25/2018  Kristi Leon  WUJ:[id number]  DOB: 09-03-1933   DOA: 07/05/2018  Referring Physician: Carron Curie, MD  HPI: Kristi Leon is a 85 y.o. female seen for follow up of Acute on Chronic Respiratory Failure.  Patient remains capped on room air doing well at this time.  Medications: Reviewed on Rounds  Physical Exam:  Vitals: Pulse 103 respirations 21 BP 112/55 O2 sat 99% temp 98.5  Ventilator Settings patient's Not currently on ventilator.  . General: Comfortable at this time . Eyes: Grossly normal lids, irises & conjunctiva . ENT: grossly tongue is normal . Neck: no obvious mass . Cardiovascular: S1 S2 normal no gallop . Respiratory: No rales or rhonchi noted . Abdomen: soft . Skin: no rash seen on limited exam . Musculoskeletal: not rigid . Psychiatric:unable to assess . Neurologic: no seizure no involuntary movements         Lab Data:   Basic Metabolic Panel: Recent Labs  Lab 07/21/18 0515  NA 140  K 4.0  CL 102  CO2 30  GLUCOSE 104*  BUN 65*  CREATININE 1.11*  CALCIUM 8.4*    ABG: No results for input(s): PHART, PCO2ART, PO2ART, HCO3, O2SAT in the last 168 hours.  Liver Function Tests: No results for input(s): AST, ALT, ALKPHOS, BILITOT, PROT, ALBUMIN in the last 168 hours. No results for input(s): LIPASE, AMYLASE in the last 168 hours. No results for input(s): AMMONIA in the last 168 hours.  CBC: Recent Labs  Lab 07/21/18 0515  WBC 15.3*  HGB 12.2  HCT 37.6  MCV 89.5  PLT 170    Cardiac Enzymes: No results for input(s): CKTOTAL, CKMB, CKMBINDEX, TROPONINI in the last 168 hours.  BNP (last 3 results) No results for input(s): BNP in the last 8760 hours.  ProBNP (last 3 results) No results for input(s): PROBNP in the last 8760 hours.  Radiological Exams: No results  found.  Assessment/Plan Active Problems:   Acute on chronic respiratory failure with hypoxia (HCC)   Chronic atrial fibrillation   Community acquired pneumonia, bilateral   Obstructive sleep apnea   Pulmonary hypertension, primary (HCC)   1. Acute on chronic respiratory failure with hypoxia continue capping trials as tolerated.  Patient is on room air at this time and doing well.  Continue aggressive pulmonary toilet and supportive measures. 2. Chronic atrial fibrillation rate controlled continue to monitor 3. Community-acquired pneumonia treated follow-up radiologically. 4. Obstructive sleep apnea continue to monitor for nocturnal desaturation. 5. Pulmonary hypertension on oxygen therapy continue supportive measures.   I have personally seen and evaluated the patient, evaluated laboratory and imaging results, formulated the assessment and plan and placed orders. The Patient requires high complexity decision making for assessment and support.  Case was discussed on Rounds with the Respiratory Therapy Staff  Yevonne Pax, MD [facility] Pulmonary Critical Care Medicine Sleep Medicine

## 2018-07-26 DIAGNOSIS — J189 Pneumonia, unspecified organism: Secondary | ICD-10-CM | POA: Diagnosis not present

## 2018-07-26 DIAGNOSIS — J9621 Acute and chronic respiratory failure with hypoxia: Secondary | ICD-10-CM | POA: Diagnosis not present

## 2018-07-26 DIAGNOSIS — I482 Chronic atrial fibrillation, unspecified: Secondary | ICD-10-CM | POA: Diagnosis not present

## 2018-07-26 DIAGNOSIS — G4733 Obstructive sleep apnea (adult) (pediatric): Secondary | ICD-10-CM | POA: Diagnosis not present

## 2018-07-26 NOTE — Progress Notes (Signed)
Pulmonary Critical Care Medicine [facility] [place]   PULMONARY CRITICAL CARE SERVICE  PROGRESS NOTE  Date of Service: 07/26/2018  Kristi Leon  ZHY:[id number]  DOB: 1933/11/04   DOA: 07/05/2018  Referring Physician: Carron Curie, MD  HPI: Kristi Leon is a 85 y.o. female seen for follow up of Acute on Chronic Respiratory Failure.  Capping at this time is on room air doing well.  Patient has had no desaturations noted presently  Medications: Reviewed on Rounds  Physical Exam:  Vitals: Temperature 97.9 pulse 100 respiratory 24 blood pressure 153/85 saturations 94%  Ventilator Settings capping on room air  . General: Comfortable at this time . Eyes: Grossly normal lids, irises & conjunctiva . ENT: grossly tongue is normal . Neck: no obvious mass . Cardiovascular: S1 S2 normal no gallop . Respiratory: No rhonchi or rales are noted at this time . Abdomen: soft . Skin: no rash seen on limited exam . Musculoskeletal: not rigid . Psychiatric:unable to assess . Neurologic: no seizure no involuntary movements         Lab Data:   Basic Metabolic Panel: Recent Labs  Lab 07/21/18 0515  NA 140  K 4.0  CL 102  CO2 30  GLUCOSE 104*  BUN 65*  CREATININE 1.11*  CALCIUM 8.4*    ABG: No results for input(s): PHART, PCO2ART, PO2ART, HCO3, O2SAT in the last 168 hours.  Liver Function Tests: No results for input(s): AST, ALT, ALKPHOS, BILITOT, PROT, ALBUMIN in the last 168 hours. No results for input(s): LIPASE, AMYLASE in the last 168 hours. No results for input(s): AMMONIA in the last 168 hours.  CBC: Recent Labs  Lab 07/21/18 0515  WBC 15.3*  HGB 12.2  HCT 37.6  MCV 89.5  PLT 170    Cardiac Enzymes: No results for input(s): CKTOTAL, CKMB, CKMBINDEX, TROPONINI in the last 168 hours.  BNP (last 3 results) No results for input(s): BNP in the last 8760 hours.  ProBNP (last 3 results) No results for input(s): PROBNP in the last 8760  hours.  Radiological Exams: No results found.  Assessment/Plan Active Problems:   Acute on chronic respiratory failure with hypoxia (HCC)   Chronic atrial fibrillation   Community acquired pneumonia, bilateral   Obstructive sleep apnea   Pulmonary hypertension, primary (HCC)   1. Acute on chronic respiratory failure with hypoxia continue with capping trials as mentioned patient is on room air we will continue secretion management supportive care 2. Chronic atrial fibrillation rate controlled we will continue to monitor 3. Community-acquired pneumonia treated improved 4. Obstructive sleep apnea will need to monitor for desaturations while she is capping 5. Pulmonary hypertension continue with oxygen therapy as necessary   I have personally seen and evaluated the patient, evaluated laboratory and imaging results, formulated the assessment and plan and placed orders. The Patient requires high complexity decision making for assessment and support.  Case was discussed on Rounds with the Respiratory Therapy Staff  Yevonne Pax, MD [facility] Pulmonary Critical Care Medicine Sleep Medicine

## 2018-07-27 DIAGNOSIS — G4733 Obstructive sleep apnea (adult) (pediatric): Secondary | ICD-10-CM | POA: Diagnosis not present

## 2018-07-27 DIAGNOSIS — I482 Chronic atrial fibrillation, unspecified: Secondary | ICD-10-CM | POA: Diagnosis not present

## 2018-07-27 DIAGNOSIS — J189 Pneumonia, unspecified organism: Secondary | ICD-10-CM | POA: Diagnosis not present

## 2018-07-27 DIAGNOSIS — J9621 Acute and chronic respiratory failure with hypoxia: Secondary | ICD-10-CM | POA: Diagnosis not present

## 2018-07-27 NOTE — Progress Notes (Signed)
Pulmonary Critical Care Medicine [facility] [place]   PULMONARY CRITICAL CARE SERVICE  PROGRESS NOTE  Date of Service: 07/27/2018  Kristi Leon  [id number]  DOB: 1933-11-20   DOA: 07/05/2018  Referring Physician: Carron Curie, MD  HPI: Kristi Leon is a 85 y.o. female seen for follow up of Acute on Chronic Respiratory Failure.  Patient is capping currently on room air no distress at this time.  Medications: Reviewed on Rounds  Physical Exam:  Vitals: Temperature is 97.8 pulse 101 respiratory 20 blood pressure 155/77 saturation 97%  Ventilator Settings capping off the ventilator  . General: Comfortable at this time . Eyes: Grossly normal lids, irises & conjunctiva . ENT: grossly tongue is normal . Neck: no obvious mass . Cardiovascular: S1 S2 normal no gallop . Respiratory: No rhonchi or rales are noted at this time . Abdomen: soft . Skin: no rash seen on limited exam . Musculoskeletal: not rigid . Psychiatric:unable to assess . Neurologic: no seizure no involuntary movements         Lab Data:   Basic Metabolic Panel: Recent Labs  Lab 07/21/18 0515  NA 140  K 4.0  CL 102  CO2 30  GLUCOSE 104*  BUN 65*  CREATININE 1.11*  CALCIUM 8.4*    ABG: No results for input(s): PHART, PCO2ART, PO2ART, HCO3, O2SAT in the last 168 hours.  Liver Function Tests: No results for input(s): AST, ALT, ALKPHOS, BILITOT, PROT, ALBUMIN in the last 168 hours. No results for input(s): LIPASE, AMYLASE in the last 168 hours. No results for input(s): AMMONIA in the last 168 hours.  CBC: Recent Labs  Lab 07/21/18 0515  WBC 15.3*  HGB 12.2  HCT 37.6  MCV 89.5  PLT 170    Cardiac Enzymes: No results for input(s): CKTOTAL, CKMB, CKMBINDEX, TROPONINI in the last 168 hours.  BNP (last 3 results) No results for input(s): BNP in the last 8760 hours.  ProBNP (last 3 results) No results for input(s): PROBNP in the last 8760 hours.  Radiological Exams: No  results found.  Assessment/Plan Active Problems:   Acute on chronic respiratory failure with hypoxia (HCC)   Chronic atrial fibrillation   Community acquired pneumonia, bilateral   Obstructive sleep apnea   Pulmonary hypertension, primary (HCC)   1. Acute on chronic respiratory failure with hypoxia we will continue with wean and proceed to decannulate today patient has had no significant desaturations noted over several nights. 2. Chronic atrial fibrillation rate is controlled 3. Community-acquired pneumonia resolved 4. OSA we will monitor post decannulation 5. Pulmonary hypertension oxygen as necessary   I have personally seen and evaluated the patient, evaluated laboratory and imaging results, formulated the assessment and plan and placed orders. The Patient requires high complexity decision making for assessment and support.  Case was discussed on Rounds with the Respiratory Therapy Staff  Yevonne Pax, MD [facility] Pulmonary Critical Care Medicine Sleep Medicine

## 2018-07-28 DIAGNOSIS — I482 Chronic atrial fibrillation, unspecified: Secondary | ICD-10-CM | POA: Diagnosis not present

## 2018-07-28 DIAGNOSIS — J189 Pneumonia, unspecified organism: Secondary | ICD-10-CM | POA: Diagnosis not present

## 2018-07-28 DIAGNOSIS — G4733 Obstructive sleep apnea (adult) (pediatric): Secondary | ICD-10-CM | POA: Diagnosis not present

## 2018-07-28 DIAGNOSIS — J9621 Acute and chronic respiratory failure with hypoxia: Secondary | ICD-10-CM | POA: Diagnosis not present

## 2018-07-28 NOTE — Progress Notes (Signed)
Pulmonary Critical Care Medicine [facility] [place]   PULMONARY CRITICAL CARE SERVICE  PROGRESS NOTE  Date of Service: 07/28/2018  Kristi Leon  [id number]:[id number]  DOB: 1933-12-04   DOA: 07/05/2018  Referring Physician: Carron Curie, MD  HPI: Kristi Leon is a 85 y.o. female seen for follow up of Acute on Chronic Respiratory Failure.  Currently is resting comfortably decannulated on room air  Medications: Reviewed on Rounds  Physical Exam:  Vitals: Temperature 98.0 pulse 77 respiratory 14 blood pressure 134/73 saturations 100%  Ventilator Settings off the ventilator decannulated  . General: Comfortable at this time . Eyes: Grossly normal lids, irises & conjunctiva . ENT: grossly tongue is normal . Neck: no obvious mass . Cardiovascular: S1 S2 normal no gallop . Respiratory: No rhonchi or rales are noted . Abdomen: soft . Skin: no rash seen on limited exam . Musculoskeletal: not rigid . Psychiatric:unable to assess . Neurologic: no seizure no involuntary movements         Lab Data:   Basic Metabolic Panel: No results for input(s): NA, K, CL, CO2, GLUCOSE, BUN, CREATININE, CALCIUM, MG, PHOS in the last 168 hours.  ABG: No results for input(s): PHART, PCO2ART, PO2ART, HCO3, O2SAT in the last 168 hours.  Liver Function Tests: No results for input(s): AST, ALT, ALKPHOS, BILITOT, PROT, ALBUMIN in the last 168 hours. No results for input(s): LIPASE, AMYLASE in the last 168 hours. No results for input(s): AMMONIA in the last 168 hours.  CBC: No results for input(s): WBC, NEUTROABS, HGB, HCT, MCV, PLT in the last 168 hours.  Cardiac Enzymes: No results for input(s): CKTOTAL, CKMB, CKMBINDEX, TROPONINI in the last 168 hours.  BNP (last 3 results) No results for input(s): BNP in the last 8760 hours.  ProBNP (last 3 results) No results for input(s): PROBNP in the last 8760 hours.  Radiological Exams: No results found.  Assessment/Plan Active  Problems:   Acute on chronic respiratory failure with hypoxia (HCC)   Chronic atrial fibrillation   Community acquired pneumonia, bilateral   Obstructive sleep apnea   Pulmonary hypertension, primary (HCC)   1. Acute on chronic respiratory failure with hypoxia we will continue with supportive care oxygen therapy as necessary 2. Chronic atrial fibrillation currently rate is controlled 3. Pneumonia treated improving 4. Sleep apnea at baseline 5. Pulmonary hypertension oxygen therapy as necessary   I have personally seen and evaluated the patient, evaluated laboratory and imaging results, formulated the assessment and plan and placed orders. The Patient requires high complexity decision making for assessment and support.  Case was discussed on Rounds with the Respiratory Therapy Staff  Yevonne Pax, MD [facility] Pulmonary Critical Care Medicine Sleep Medicine

## 2019-09-08 DEATH — deceased

## 2019-09-11 IMAGING — DX DG CHEST 1V PORT
1 series · 1 of 1 positions shown · non-contrast
Comparison: None.

CLINICAL DATA: Chronic respiratory failure with hypoxia.

EXAM:
PORTABLE CHEST 1 VIEW

[chest]
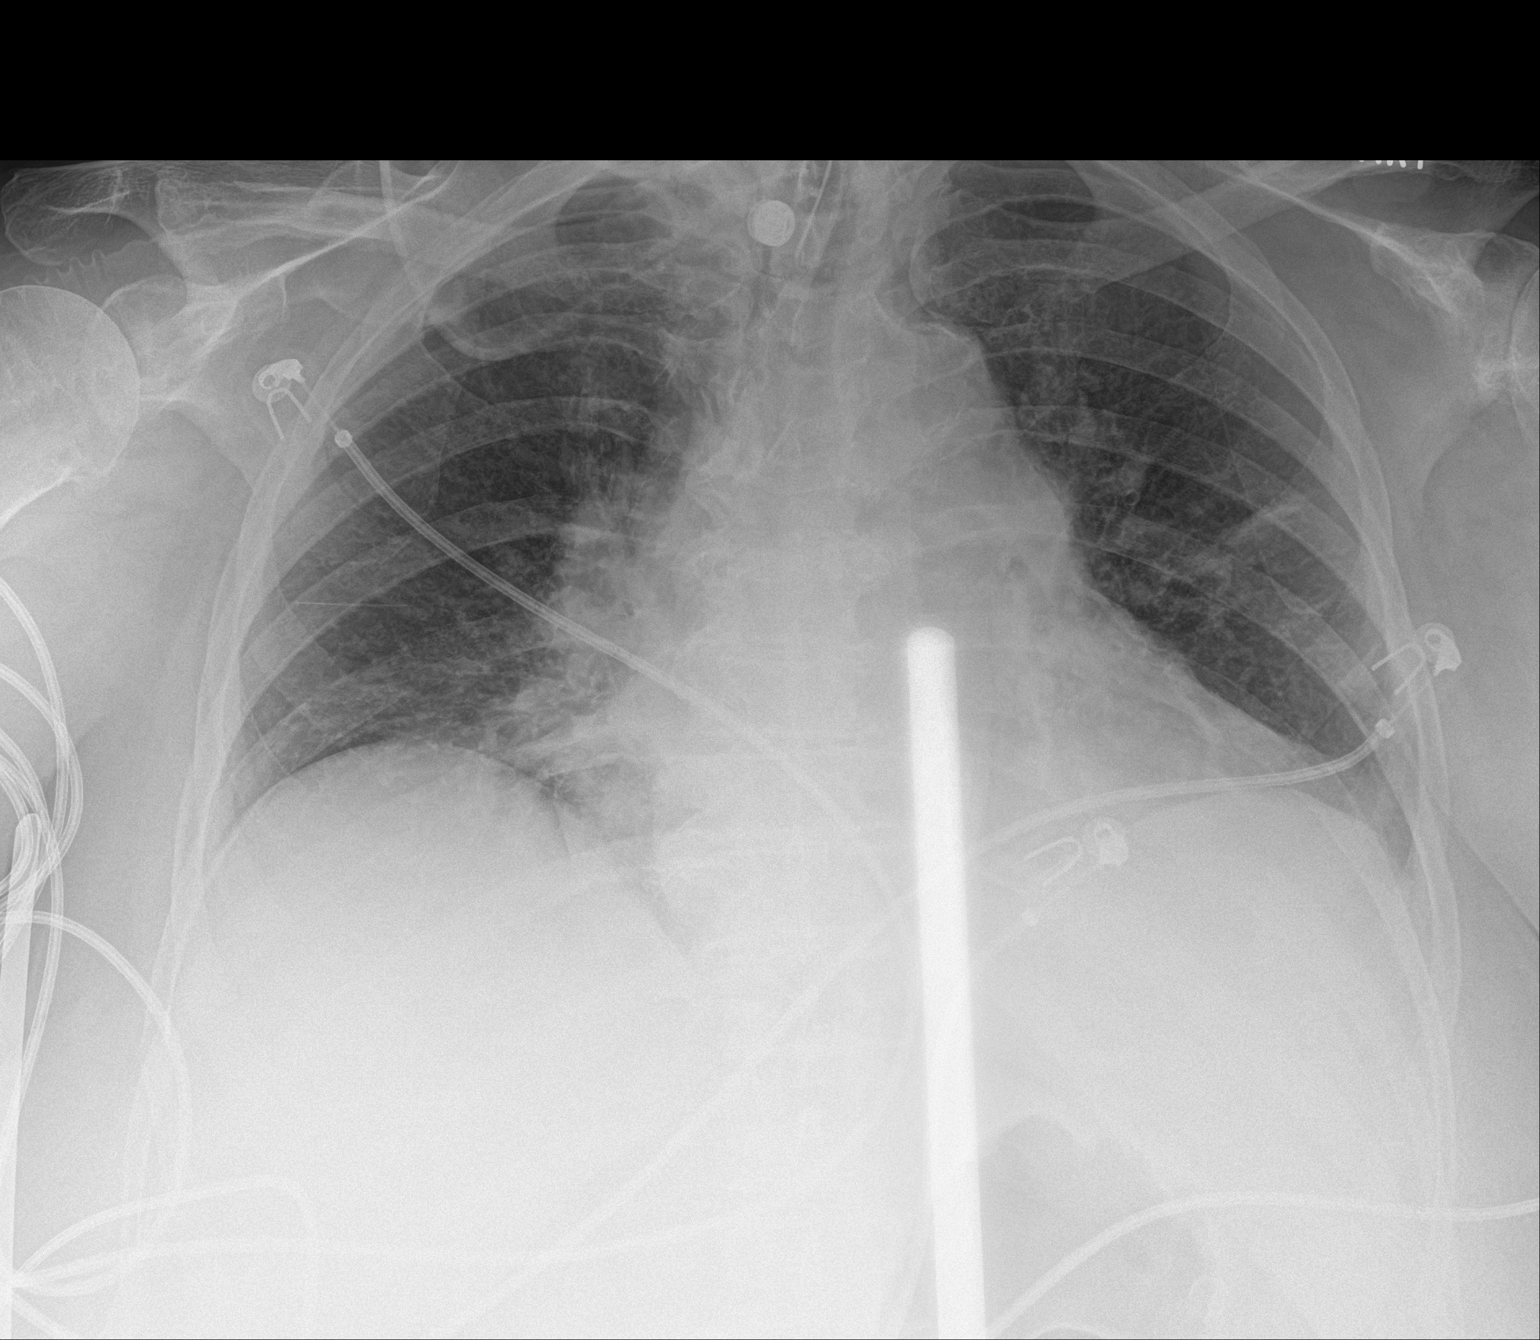

[1 of 1 positions shown; findings below may reference images not displayed]

FINDINGS: Mild cardiomegaly is noted. Tracheostomy tube is in good position.
Hypoinflation of the lungs is noted with mild bibasilar subsegmental
atelectasis. No pneumothorax or pleural effusion is noted. The
visualized skeletal structures are unremarkable.
IMPRESSION: Hypoinflation of the lungs with mild bibasilar subsegmental
atelectasis.
